# Patient Record
Sex: Female | Born: 1955 | Race: White | Hispanic: No | Marital: Single | Smoking: Former smoker
Health system: Southern US, Community
[De-identification: ages and names within clinical notes are randomized; demographics above are authoritative.]

## PROBLEM LIST (undated history)

## (undated) DIAGNOSIS — G35 Multiple sclerosis: Secondary | ICD-10-CM

## (undated) DIAGNOSIS — N63 Unspecified lump in unspecified breast: Secondary | ICD-10-CM

## (undated) DIAGNOSIS — M1990 Unspecified osteoarthritis, unspecified site: Secondary | ICD-10-CM

## (undated) DIAGNOSIS — G35D Multiple sclerosis, unspecified: Secondary | ICD-10-CM

## (undated) DIAGNOSIS — K219 Gastro-esophageal reflux disease without esophagitis: Secondary | ICD-10-CM

## (undated) HISTORY — PX: NOSE SURGERY: SHX723

## (undated) HISTORY — PX: ESOPHAGOGASTRODUODENOSCOPY: SHX1529

---

## 1992-06-23 HISTORY — PX: BREAST CYST EXCISION: SHX579

## 1992-06-23 HISTORY — PX: BREAST EXCISIONAL BIOPSY: SUR124

## 1998-03-08 ENCOUNTER — Other Ambulatory Visit: Admission: RE | Admit: 1998-03-08 | Discharge: 1998-03-08 | Payer: Self-pay | Admitting: Obstetrics & Gynecology

## 1999-04-04 ENCOUNTER — Other Ambulatory Visit: Admission: RE | Admit: 1999-04-04 | Discharge: 1999-04-04 | Payer: Self-pay | Admitting: Obstetrics & Gynecology

## 2000-06-03 ENCOUNTER — Other Ambulatory Visit: Admission: RE | Admit: 2000-06-03 | Discharge: 2000-06-03 | Payer: Self-pay | Admitting: Obstetrics & Gynecology

## 2001-06-03 ENCOUNTER — Other Ambulatory Visit: Admission: RE | Admit: 2001-06-03 | Discharge: 2001-06-03 | Payer: Self-pay | Admitting: Obstetrics & Gynecology

## 2002-06-22 ENCOUNTER — Other Ambulatory Visit: Admission: RE | Admit: 2002-06-22 | Discharge: 2002-06-22 | Payer: Self-pay | Admitting: Obstetrics & Gynecology

## 2003-08-03 ENCOUNTER — Other Ambulatory Visit: Admission: RE | Admit: 2003-08-03 | Discharge: 2003-08-03 | Payer: Self-pay | Admitting: Obstetrics & Gynecology

## 2004-10-02 ENCOUNTER — Other Ambulatory Visit
Admission: RE | Admit: 2004-10-02 | Discharge: 2004-10-02 | Payer: BLUE CROSS/BLUE SHIELD | Admitting: Obstetrics & Gynecology

## 2012-01-13 DIAGNOSIS — G35 Multiple sclerosis: Secondary | ICD-10-CM | POA: Insufficient documentation

## 2015-05-24 ENCOUNTER — Other Ambulatory Visit: Payer: Self-pay | Admitting: Neurosurgery

## 2015-05-29 ENCOUNTER — Encounter (HOSPITAL_COMMUNITY): Payer: Self-pay

## 2015-05-29 ENCOUNTER — Encounter (HOSPITAL_COMMUNITY)
Admission: RE | Admit: 2015-05-29 | Discharge: 2015-05-29 | Disposition: A | Discharge status: Home / Self Care | Payer: BLUE CROSS/BLUE SHIELD | Source: Ambulatory Visit | Attending: Neurosurgery | Admitting: Neurosurgery

## 2015-05-29 DIAGNOSIS — Z01812 Encounter for preprocedural laboratory examination: Secondary | ICD-10-CM | POA: Insufficient documentation

## 2015-05-29 DIAGNOSIS — M4310 Spondylolisthesis, site unspecified: Secondary | ICD-10-CM | POA: Diagnosis not present

## 2015-05-29 DIAGNOSIS — Z0183 Encounter for blood typing: Secondary | ICD-10-CM | POA: Insufficient documentation

## 2015-05-29 HISTORY — DX: Unspecified osteoarthritis, unspecified site: M19.90

## 2015-05-29 HISTORY — DX: Gastro-esophageal reflux disease without esophagitis: K21.9

## 2015-05-29 HISTORY — DX: Multiple sclerosis, unspecified: G35.D

## 2015-05-29 HISTORY — DX: Multiple sclerosis: G35

## 2015-05-29 LAB — SURGICAL PCR SCREEN
MRSA, PCR: NEGATIVE
Staphylococcus aureus: NEGATIVE

## 2015-05-29 LAB — BASIC METABOLIC PANEL
Anion gap: 10 (ref 5–15)
BUN: 8 mg/dL (ref 6–20)
CHLORIDE: 107 mmol/L (ref 101–111)
CO2: 23 mmol/L (ref 22–32)
CREATININE: 0.58 mg/dL (ref 0.44–1.00)
Calcium: 8.9 mg/dL (ref 8.9–10.3)
Glucose, Bld: 99 mg/dL (ref 65–99)
POTASSIUM: 4 mmol/L (ref 3.5–5.1)
Sodium: 140 mmol/L (ref 135–145)

## 2015-05-29 LAB — CBC
HEMATOCRIT: 38.6 % (ref 36.0–46.0)
Hemoglobin: 13.1 g/dL (ref 12.0–15.0)
MCH: 33 pg (ref 26.0–34.0)
MCHC: 33.9 g/dL (ref 30.0–36.0)
MCV: 97.2 fL (ref 78.0–100.0)
Platelets: 231 10*3/uL (ref 150–400)
RBC: 3.97 MIL/uL (ref 3.87–5.11)
RDW: 11.7 % (ref 11.5–15.5)
WBC: 4.7 10*3/uL (ref 4.0–10.5)

## 2015-05-29 LAB — TYPE AND SCREEN
ABO/RH(D): AB POS
Antibody Screen: NEGATIVE

## 2015-05-29 LAB — ABO/RH: ABO/RH(D): AB POS

## 2015-05-29 NOTE — Pre-Procedure Instructions (Signed)
Christina Richard  05/29/2015     Your procedure is scheduled on : Tuesday June 05, 2015 at 11:00 AM.  Report to [facility] Admitting at 8:00 AM.  Call this number if you have problems the morning of surgery: [phone]    Remember:  Do not eat food or drink liquids after midnight.   Take these medicines the morning of surgery with A SIP OF WATER : Nexium, Estradiol (Estrace), and Tecfidera    Stop taking any aspirin, herbal medications, vitamins, Ibuprofen, Advil, Motrin, Aleve, etc    Do not wear jewelry, make-up or nail polish.  Do not wear lotions, powders, or perfumes.    Do not shave 48 hours prior to surgery.    Do not bring valuables to the hospital.  [facility] is not responsible for any belongings or valuables.  Contacts, dentures or bridgework may not be worn into surgery.  Leave your suitcase in the car.  After surgery it may be brought to your room.  For patients admitted to the hospital, discharge time will be determined by your treatment team.  Patients discharged the day of surgery will not be allowed to drive home.   Name and phone number of your driver:    Special instructions:  Shower using CHG soap the night before and the morning of your surgery  Please read over the following fact sheets that you were given. Pain Booklet, Coughing and Deep Breathing, Blood Transfusion Information, MRSA Information and Surgical Site Infection Prevention

## 2015-06-05 ENCOUNTER — Inpatient Hospital Stay (HOSPITAL_COMMUNITY): Admission: RE | Admit: 2015-06-05 | Payer: BLUE CROSS/BLUE SHIELD | Source: Ambulatory Visit | Admitting: Neurosurgery

## 2015-06-05 ENCOUNTER — Encounter (HOSPITAL_COMMUNITY): Admission: RE | Payer: BLUE CROSS/BLUE SHIELD | Source: Ambulatory Visit

## 2015-06-05 SURGERY — ANTERIOR LATERAL LUMBAR FUSION 1 LEVEL
Anesthesia: General

## 2015-06-06 ENCOUNTER — Other Ambulatory Visit: Payer: Self-pay | Admitting: Neurosurgery

## 2015-06-13 ENCOUNTER — Encounter (HOSPITAL_COMMUNITY): Payer: Self-pay | Admitting: *Deleted

## 2015-06-13 DIAGNOSIS — G35 Multiple sclerosis: Secondary | ICD-10-CM | POA: Insufficient documentation

## 2015-06-13 DIAGNOSIS — M1990 Unspecified osteoarthritis, unspecified site: Secondary | ICD-10-CM | POA: Insufficient documentation

## 2015-06-13 DIAGNOSIS — M4310 Spondylolisthesis, site unspecified: Secondary | ICD-10-CM

## 2015-06-13 DIAGNOSIS — G2581 Restless legs syndrome: Secondary | ICD-10-CM | POA: Insufficient documentation

## 2015-06-13 MED ORDER — CEFAZOLIN SODIUM-DEXTROSE 2-3 GM-% IV SOLR
2.0000 g | INTRAVENOUS | Status: AC
  Administered 2015-06-14: 2 g via INTRAVENOUS
  Filled 2015-06-13: qty 50

## 2015-06-13 NOTE — Anesthesia Preprocedure Evaluation (Addendum)
Anesthesia Evaluation  Patient identified by MRN, date of birth, ID bandGeneral Assessment Comment: Christina Richard is a 59 y/o female with past history significant for relapsing-remitting multiple sclerosis on Tecfidera - doing well on the medication, GERD, and arthritis here today for a ANTERIOR LATERAL LUMBAR FUSION 1 LEVEL LUMBAR PERCUTANEOUS PEDICLE SCREW 1 LEVEL by Dr. Conchita Paris d/t spondylolisthesis.       Reviewed: Allergy & Precautions, NPO status , Patient's Chart, lab work & pertinent test results  History of Anesthesia Complications Negative for: history of anesthetic complications  Airway Mallampati: II  TM Distance: >3 FB Neck ROM: Full    Dental  (+) Teeth Intact   Pulmonary former smoker,    breath sounds clear to auscultation       Cardiovascular Exercise Tolerance: Poor negative cardio ROS   Rhythm:Regular     Neuro/Psych Restless leg syndrome MS     Neuromuscular disease negative psych ROS   GI/Hepatic Neg liver ROS, GERD  Medicated and Controlled,  Endo/Other  negative endocrine ROS  Renal/GU negative Renal ROS     Musculoskeletal  (+) Arthritis , Osteoarthritis,    Abdominal   Peds  Hematology   Anesthesia Other Findings   Reproductive/Obstetrics                            BP Readings from Last 3 Encounters:  05/29/15 121/63   Lab Results  Component Value Date   WBC 4.7 05/29/2015   HGB 13.1 05/29/2015   HCT 38.6 05/29/2015   MCV 97.2 05/29/2015   PLT 231 05/29/2015     Chemistry      Component Value Date/Time   NA 140 05/29/2015 1224   K 4.0 05/29/2015 1224   CL 107 05/29/2015 1224   CO2 23 05/29/2015 1224   BUN 8 05/29/2015 1224   CREATININE 0.58 05/29/2015 1224      Component Value Date/Time   CALCIUM 8.9 05/29/2015 1224      Anesthesia Physical Anesthesia Plan  ASA: II  Anesthesia Plan: General   Post-op Pain Management:     Induction: Intravenous  Airway Management Planned: Oral ETT  Additional Equipment: None  Intra-op Plan:   Post-operative Plan: Extubation in OR  Informed Consent: I have reviewed the patients History and Physical, chart, labs and discussed the procedure including the risks, benefits and alternatives for the proposed anesthesia with the patient or authorized representative who has indicated his/her understanding and acceptance.   Dental advisory given  Plan Discussed with: CRNA and Surgeon  Anesthesia Plan Comments:         Anesthesia Quick Evaluation

## 2015-06-13 NOTE — Progress Notes (Signed)
Pt denies SOB, chest pain, and being under the care of a cardiologist. Pt denies having any cardiac studies. Pt made aware to stop Stop taking Aspirin, otc vitamins, fish oil, and herbal medications. Do not take any NSAIDs ie: Ibuprofen, Advil, Naproxen or any medication containing Aspirin. Pt verbalized understanding of all pre-op instructions.

## 2015-06-14 ENCOUNTER — Encounter (HOSPITAL_COMMUNITY): Payer: Self-pay | Admitting: *Deleted

## 2015-06-14 ENCOUNTER — Inpatient Hospital Stay (HOSPITAL_COMMUNITY): Payer: BLUE CROSS/BLUE SHIELD | Admitting: Certified Registered Nurse Anesthetist

## 2015-06-14 ENCOUNTER — Inpatient Hospital Stay (HOSPITAL_COMMUNITY): Payer: BLUE CROSS/BLUE SHIELD

## 2015-06-14 ENCOUNTER — Encounter (HOSPITAL_COMMUNITY)
Admission: RE | Disposition: A | Discharge status: Home / Self Care | Payer: Self-pay | Source: Ambulatory Visit | Attending: Neurosurgery

## 2015-06-14 ENCOUNTER — Inpatient Hospital Stay (HOSPITAL_COMMUNITY)
Admission: RE | Admit: 2015-06-14 | Discharge: 2015-06-15 | DRG: 460 | Disposition: A | Discharge status: Home / Self Care | Payer: BLUE CROSS/BLUE SHIELD | Source: Ambulatory Visit | Attending: Neurosurgery | Admitting: Neurosurgery

## 2015-06-14 DIAGNOSIS — M4310 Spondylolisthesis, site unspecified: Secondary | ICD-10-CM

## 2015-06-14 DIAGNOSIS — Z87891 Personal history of nicotine dependence: Secondary | ICD-10-CM | POA: Diagnosis not present

## 2015-06-14 DIAGNOSIS — M4316 Spondylolisthesis, lumbar region: Principal | ICD-10-CM | POA: Diagnosis present

## 2015-06-14 DIAGNOSIS — K219 Gastro-esophageal reflux disease without esophagitis: Secondary | ICD-10-CM | POA: Diagnosis present

## 2015-06-14 DIAGNOSIS — G2581 Restless legs syndrome: Secondary | ICD-10-CM | POA: Diagnosis present

## 2015-06-14 DIAGNOSIS — M47816 Spondylosis without myelopathy or radiculopathy, lumbar region: Secondary | ICD-10-CM | POA: Diagnosis present

## 2015-06-14 DIAGNOSIS — Z79899 Other long term (current) drug therapy: Secondary | ICD-10-CM | POA: Diagnosis not present

## 2015-06-14 DIAGNOSIS — Z419 Encounter for procedure for purposes other than remedying health state, unspecified: Secondary | ICD-10-CM

## 2015-06-14 DIAGNOSIS — G35 Multiple sclerosis: Secondary | ICD-10-CM | POA: Diagnosis present

## 2015-06-14 DIAGNOSIS — M79606 Pain in leg, unspecified: Secondary | ICD-10-CM | POA: Diagnosis present

## 2015-06-14 HISTORY — PX: LUMBAR PERCUTANEOUS PEDICLE SCREW 1 LEVEL: SHX5560

## 2015-06-14 HISTORY — PX: ANTERIOR LAT LUMBAR FUSION: SHX1168

## 2015-06-14 LAB — CBC
HCT: 40.3 % (ref 36.0–46.0)
HEMOGLOBIN: 13.8 g/dL (ref 12.0–15.0)
MCH: 33.7 pg (ref 26.0–34.0)
MCHC: 34.2 g/dL (ref 30.0–36.0)
MCV: 98.5 fL (ref 78.0–100.0)
PLATELETS: 212 10*3/uL (ref 150–400)
RBC: 4.09 MIL/uL (ref 3.87–5.11)
RDW: 11.9 % (ref 11.5–15.5)
WBC: 5.1 10*3/uL (ref 4.0–10.5)

## 2015-06-14 LAB — BASIC METABOLIC PANEL
ANION GAP: 8 (ref 5–15)
BUN: 11 mg/dL (ref 6–20)
CALCIUM: 9.2 mg/dL (ref 8.9–10.3)
CO2: 25 mmol/L (ref 22–32)
Chloride: 107 mmol/L (ref 101–111)
Creatinine, Ser: 0.46 mg/dL (ref 0.44–1.00)
Glucose, Bld: 87 mg/dL (ref 65–99)
Potassium: 4.3 mmol/L (ref 3.5–5.1)
SODIUM: 140 mmol/L (ref 135–145)

## 2015-06-14 LAB — TYPE AND SCREEN
ABO/RH(D): AB POS
ANTIBODY SCREEN: NEGATIVE

## 2015-06-14 SURGERY — ANTERIOR LATERAL LUMBAR FUSION 1 LEVEL
Anesthesia: General | Site: Spine Lumbar

## 2015-06-14 MED ORDER — FENTANYL CITRATE (PF) 250 MCG/5ML IJ SOLN
INTRAMUSCULAR | Status: AC
  Filled 2015-06-14: qty 5

## 2015-06-14 MED ORDER — MULTI-VITAMIN/MINERALS PO TABS: 1.0000 | ORAL_TABLET | Freq: Every day | ORAL | Status: DC

## 2015-06-14 MED ORDER — LACTATED RINGERS IV SOLN
INTRAVENOUS | Status: DC | PRN
  Administered 2015-06-14 (×3): via INTRAVENOUS

## 2015-06-14 MED ORDER — MIDAZOLAM HCL 2 MG/2ML IJ SOLN
INTRAMUSCULAR | Status: AC
  Filled 2015-06-14: qty 2

## 2015-06-14 MED ORDER — GLYCOPYRROLATE 0.2 MG/ML IJ SOLN
INTRAMUSCULAR | Status: AC
  Filled 2015-06-14: qty 1

## 2015-06-14 MED ORDER — HYDROMORPHONE HCL 1 MG/ML IJ SOLN
INTRAMUSCULAR | Status: AC
  Administered 2015-06-14: 0.5 mg via INTRAVENOUS
  Filled 2015-06-14: qty 1

## 2015-06-14 MED ORDER — ACETAMINOPHEN 160 MG/5ML PO SOLN: 325.0000 mg | ORAL | Status: DC | PRN

## 2015-06-14 MED ORDER — METHOCARBAMOL 500 MG PO TABS
ORAL_TABLET | ORAL | Status: AC
Start: 2015-06-14 — End: 2015-06-15
  Filled 2015-06-14: qty 1

## 2015-06-14 MED ORDER — PROPOFOL 10 MG/ML IV BOLUS
INTRAVENOUS | Status: AC
  Filled 2015-06-14: qty 40

## 2015-06-14 MED ORDER — ARTIFICIAL TEARS OP OINT
TOPICAL_OINTMENT | OPHTHALMIC | Status: AC
  Filled 2015-06-14: qty 3.5

## 2015-06-14 MED ORDER — MENTHOL 3 MG MT LOZG: 1.0000 | LOZENGE | OROMUCOSAL | Status: DC | PRN

## 2015-06-14 MED ORDER — ADULT MULTIVITAMIN W/MINERALS CH: 1.0000 | ORAL_TABLET | Freq: Every day | ORAL | Status: DC

## 2015-06-14 MED ORDER — ALBUMIN HUMAN 5 % IV SOLN
INTRAVENOUS | Status: DC | PRN
  Administered 2015-06-14: 11:00:00 via INTRAVENOUS

## 2015-06-14 MED ORDER — 0.9 % SODIUM CHLORIDE (POUR BTL) OPTIME
TOPICAL | Status: DC | PRN
  Administered 2015-06-14: 1000 mL

## 2015-06-14 MED ORDER — ARTIFICIAL TEARS OP OINT
TOPICAL_OINTMENT | OPHTHALMIC | Status: DC | PRN
  Administered 2015-06-14: 1 via OPHTHALMIC

## 2015-06-14 MED ORDER — DIMETHYL FUMARATE 240 MG PO CPDR: 1.0000 | DELAYED_RELEASE_CAPSULE | Freq: Two times a day (BID) | ORAL | Status: DC

## 2015-06-14 MED ORDER — DOCUSATE SODIUM 100 MG PO CAPS
100.0000 mg | ORAL_CAPSULE | Freq: Two times a day (BID) | ORAL | Status: DC
  Administered 2015-06-14: 100 mg via ORAL
  Filled 2015-06-14: qty 1

## 2015-06-14 MED ORDER — VITAMIN D 1000 UNITS PO TABS
1000.0000 [IU] | ORAL_TABLET | Freq: Every day | ORAL | Status: DC
  Filled 2015-06-14: qty 1

## 2015-06-14 MED ORDER — MODAFINIL 100 MG PO TABS: 200.0000 mg | ORAL_TABLET | Freq: Every day | ORAL | Status: DC

## 2015-06-14 MED ORDER — SODIUM CHLORIDE 0.9 % IJ SOLN
3.0000 mL | Freq: Two times a day (BID) | INTRAMUSCULAR | Status: DC
  Administered 2015-06-14: 3 mL via INTRAVENOUS

## 2015-06-14 MED ORDER — LIDOCAINE HCL (CARDIAC) 20 MG/ML IV SOLN
INTRAVENOUS | Status: DC | PRN
  Administered 2015-06-14: 50 mg via INTRAVENOUS

## 2015-06-14 MED ORDER — SODIUM CHLORIDE 0.9 % IJ SOLN
INTRAMUSCULAR | Status: AC
  Filled 2015-06-14: qty 10

## 2015-06-14 MED ORDER — SODIUM CHLORIDE 0.9 % IV SOLN: 250.0000 mL | INTRAVENOUS | Status: DC

## 2015-06-14 MED ORDER — MEDROXYPROGESTERONE ACETATE 2.5 MG PO TABS
2.5000 mg | ORAL_TABLET | Freq: Every day | ORAL | Status: DC
  Filled 2015-06-14: qty 1

## 2015-06-14 MED ORDER — HYDROMORPHONE HCL 1 MG/ML IJ SOLN
0.2500 mg | INTRAMUSCULAR | Status: DC | PRN
  Administered 2015-06-14 (×4): 0.5 mg via INTRAVENOUS

## 2015-06-14 MED ORDER — HEPARIN SODIUM (PORCINE) 5000 UNIT/ML IJ SOLN
5000.0000 [IU] | Freq: Three times a day (TID) | INTRAMUSCULAR | Status: DC
  Filled 2015-06-14: qty 1

## 2015-06-14 MED ORDER — LIDOCAINE HCL (CARDIAC) 20 MG/ML IV SOLN
INTRAVENOUS | Status: AC
  Filled 2015-06-14: qty 5

## 2015-06-14 MED ORDER — PANTOPRAZOLE SODIUM 40 MG PO TBEC: 40.0000 mg | DELAYED_RELEASE_TABLET | Freq: Every day | ORAL | Status: DC

## 2015-06-14 MED ORDER — OXYCODONE-ACETAMINOPHEN 5-325 MG PO TABS
1.0000 | ORAL_TABLET | ORAL | Status: DC | PRN
  Administered 2015-06-14 – 2015-06-15 (×5): 2 via ORAL
  Filled 2015-06-14 (×4): qty 2

## 2015-06-14 MED ORDER — SUCCINYLCHOLINE CHLORIDE 20 MG/ML IJ SOLN
INTRAMUSCULAR | Status: DC | PRN
  Administered 2015-06-14: 40 mg via INTRAVENOUS

## 2015-06-14 MED ORDER — PROPOFOL 500 MG/50ML IV EMUL
INTRAVENOUS | Status: DC | PRN
  Administered 2015-06-14: 50 ug/kg/min via INTRAVENOUS

## 2015-06-14 MED ORDER — ESTRADIOL 1 MG PO TABS
0.5000 mg | ORAL_TABLET | Freq: Every day | ORAL | Status: DC
  Filled 2015-06-14: qty 0.5

## 2015-06-14 MED ORDER — MORPHINE SULFATE (PF) 2 MG/ML IV SOLN: 1.0000 mg | INTRAVENOUS | Status: DC | PRN

## 2015-06-14 MED ORDER — DEXAMETHASONE SODIUM PHOSPHATE 4 MG/ML IJ SOLN
INTRAMUSCULAR | Status: DC | PRN
  Administered 2015-06-14: 4 mg via INTRAVENOUS

## 2015-06-14 MED ORDER — HEMOSTATIC AGENTS (NO CHARGE) OPTIME
TOPICAL | Status: DC | PRN
  Administered 2015-06-14: 1 via TOPICAL

## 2015-06-14 MED ORDER — THROMBIN 5000 UNITS EX SOLR
CUTANEOUS | Status: DC | PRN
  Administered 2015-06-14 (×2): 5000 [IU] via TOPICAL

## 2015-06-14 MED ORDER — PHENYLEPHRINE HCL 10 MG/ML IJ SOLN
10.0000 mg | INTRAMUSCULAR | Status: DC | PRN
  Administered 2015-06-14: 30 ug/min via INTRAVENOUS

## 2015-06-14 MED ORDER — METHOCARBAMOL 500 MG PO TABS
500.0000 mg | ORAL_TABLET | Freq: Four times a day (QID) | ORAL | Status: DC | PRN
  Administered 2015-06-14: 500 mg via ORAL

## 2015-06-14 MED ORDER — BUPIVACAINE HCL (PF) 0.5 % IJ SOLN
INTRAMUSCULAR | Status: DC | PRN
  Administered 2015-06-14: 3.5 mL
  Administered 2015-06-14: 7 mL

## 2015-06-14 MED ORDER — ROCURONIUM BROMIDE 50 MG/5ML IV SOLN
INTRAVENOUS | Status: AC
  Filled 2015-06-14: qty 1

## 2015-06-14 MED ORDER — GLYCOPYRROLATE 0.2 MG/ML IJ SOLN
INTRAMUSCULAR | Status: DC | PRN
  Administered 2015-06-14 (×2): 0.1 mg via INTRAVENOUS

## 2015-06-14 MED ORDER — PHENOL 1.4 % MT LIQD: 1.0000 | OROMUCOSAL | Status: DC | PRN

## 2015-06-14 MED ORDER — SODIUM CHLORIDE 0.9 % IR SOLN
Status: DC | PRN
  Administered 2015-06-14: 09:00:00

## 2015-06-14 MED ORDER — OXYCODONE HCL 5 MG PO TABS: 5.0000 mg | ORAL_TABLET | Freq: Once | ORAL | Status: DC | PRN

## 2015-06-14 MED ORDER — ONDANSETRON HCL 4 MG/2ML IJ SOLN
INTRAMUSCULAR | Status: DC | PRN
  Administered 2015-06-14: 4 mg via INTRAVENOUS

## 2015-06-14 MED ORDER — ACETAMINOPHEN 650 MG RE SUPP: 650.0000 mg | RECTAL | Status: DC | PRN

## 2015-06-14 MED ORDER — ONDANSETRON HCL 4 MG/2ML IJ SOLN: 4.0000 mg | INTRAMUSCULAR | Status: DC | PRN

## 2015-06-14 MED ORDER — METHOCARBAMOL 1000 MG/10ML IJ SOLN
500.0000 mg | Freq: Four times a day (QID) | INTRAVENOUS | Status: DC | PRN
  Filled 2015-06-14: qty 5

## 2015-06-14 MED ORDER — LORATADINE 10 MG PO TABS: 10.0000 mg | ORAL_TABLET | Freq: Every day | ORAL | Status: DC

## 2015-06-14 MED ORDER — SODIUM CHLORIDE 0.9 % IJ SOLN: 3.0000 mL | INTRAMUSCULAR | Status: DC | PRN

## 2015-06-14 MED ORDER — OXYCODONE HCL 5 MG/5ML PO SOLN: 5.0000 mg | Freq: Once | ORAL | Status: DC | PRN

## 2015-06-14 MED ORDER — EPHEDRINE SULFATE 50 MG/ML IJ SOLN
INTRAMUSCULAR | Status: AC
  Filled 2015-06-14: qty 1

## 2015-06-14 MED ORDER — SENNA 8.6 MG PO TABS
1.0000 | ORAL_TABLET | Freq: Two times a day (BID) | ORAL | Status: DC
  Administered 2015-06-14: 8.6 mg via ORAL
  Filled 2015-06-14: qty 1

## 2015-06-14 MED ORDER — LIDOCAINE-EPINEPHRINE 1 %-1:100000 IJ SOLN
INTRAMUSCULAR | Status: DC | PRN
  Administered 2015-06-14: 3.5 mL
  Administered 2015-06-14: 7 mL

## 2015-06-14 MED ORDER — SUCCINYLCHOLINE CHLORIDE 20 MG/ML IJ SOLN
INTRAMUSCULAR | Status: AC
  Filled 2015-06-14: qty 1

## 2015-06-14 MED ORDER — GABAPENTIN 300 MG PO CAPS
300.0000 mg | ORAL_CAPSULE | Freq: Every day | ORAL | Status: DC
  Administered 2015-06-14: 300 mg via ORAL
  Filled 2015-06-14: qty 1

## 2015-06-14 MED ORDER — ACETAMINOPHEN 325 MG PO TABS
650.0000 mg | ORAL_TABLET | ORAL | Status: DC | PRN
Start: 2015-06-14 — End: 2015-06-15

## 2015-06-14 MED ORDER — MIDAZOLAM HCL 5 MG/5ML IJ SOLN
INTRAMUSCULAR | Status: DC | PRN
  Administered 2015-06-14: 2 mg via INTRAVENOUS

## 2015-06-14 MED ORDER — ZOLPIDEM TARTRATE 5 MG PO TABS: 5.0000 mg | ORAL_TABLET | Freq: Every evening | ORAL | Status: DC | PRN

## 2015-06-14 MED ORDER — PROPOFOL 10 MG/ML IV BOLUS
INTRAVENOUS | Status: DC | PRN
Start: 2015-06-14 — End: 2015-06-14
  Administered 2015-06-14: 110 mg via INTRAVENOUS

## 2015-06-14 MED ORDER — ACETAMINOPHEN 325 MG PO TABS: 325.0000 mg | ORAL_TABLET | ORAL | Status: DC | PRN

## 2015-06-14 MED ORDER — OXYCODONE-ACETAMINOPHEN 5-325 MG PO TABS
ORAL_TABLET | ORAL | Status: AC
  Filled 2015-06-14: qty 2

## 2015-06-14 MED ORDER — FENTANYL CITRATE (PF) 100 MCG/2ML IJ SOLN
INTRAMUSCULAR | Status: DC | PRN
  Administered 2015-06-14: 50 ug via INTRAVENOUS
  Administered 2015-06-14: 150 ug via INTRAVENOUS
  Administered 2015-06-14 (×2): 50 ug via INTRAVENOUS

## 2015-06-14 MED ORDER — CHOLECALCIFEROL 25 MCG (1000 UT) PO CAPS
1000.0000 [IU] | ORAL_CAPSULE | Freq: Every day | ORAL | Status: DC
  Filled 2015-06-14: qty 1

## 2015-06-14 MED ORDER — SODIUM CHLORIDE 0.9 % IV SOLN: INTRAVENOUS | Status: DC

## 2015-06-14 MED ORDER — PHENYLEPHRINE 40 MCG/ML (10ML) SYRINGE FOR IV PUSH (FOR BLOOD PRESSURE SUPPORT)
PREFILLED_SYRINGE | INTRAVENOUS | Status: AC
  Filled 2015-06-14: qty 10

## 2015-06-14 MED ORDER — BISACODYL 10 MG RE SUPP: 10.0000 mg | Freq: Every day | RECTAL | Status: DC | PRN

## 2015-06-14 MED ORDER — CEFAZOLIN SODIUM-DEXTROSE 2-3 GM-% IV SOLR
2.0000 g | Freq: Three times a day (TID) | INTRAVENOUS | Status: AC
  Administered 2015-06-14 – 2015-06-15 (×2): 2 g via INTRAVENOUS
  Filled 2015-06-14 (×2): qty 50

## 2015-06-14 SURGICAL SUPPLY — 78 items
BAG DECANTER FOR FLEXI CONT (MISCELLANEOUS) ×2 IMPLANT
BENZOIN TINCTURE PRP APPL 2/3 (GAUZE/BANDAGES/DRESSINGS) IMPLANT
BLADE CLIPPER SURG (BLADE) IMPLANT
BLADE SURG 11 STRL SS (BLADE) ×2 IMPLANT
BUR MATCHSTICK NEURO 3.0 LAGG (BURR) ×2 IMPLANT
BUR PRECISION FLUTE 5.0 (BURR) ×2 IMPLANT
CANISTER SUCT 3000ML PPV (MISCELLANEOUS) ×2 IMPLANT
CONT SPEC 4OZ CLIKSEAL STRL BL (MISCELLANEOUS) ×2 IMPLANT
COVER BACK TABLE 24X17X13 BIG (DRAPES) IMPLANT
COVER BACK TABLE 60X90IN (DRAPES) ×2 IMPLANT
DECANTER SPIKE VIAL GLASS SM (MISCELLANEOUS) ×4 IMPLANT
DERMABOND ADVANCED (GAUZE/BANDAGES/DRESSINGS) ×3
DERMABOND ADVANCED .7 DNX12 (GAUZE/BANDAGES/DRESSINGS) ×3 IMPLANT
DRAPE C-ARM 42X72 X-RAY (DRAPES) ×4 IMPLANT
DRAPE C-ARMOR (DRAPES) ×4 IMPLANT
DRAPE LAPAROTOMY 100X72X124 (DRAPES) ×4 IMPLANT
DRAPE POUCH INSTRU U-SHP 10X18 (DRAPES) ×4 IMPLANT
DRAPE SURG 17X23 STRL (DRAPES) ×2 IMPLANT
DRSG OPSITE 4X5.5 SM (GAUZE/BANDAGES/DRESSINGS) ×8 IMPLANT
DURAPREP 26ML APPLICATOR (WOUND CARE) ×4 IMPLANT
ELECT REM PT RETURN 9FT ADLT (ELECTROSURGICAL) ×4
ELECTRODE REM PT RTRN 9FT ADLT (ELECTROSURGICAL) ×2 IMPLANT
GAUZE SPONGE 4X4 12PLY STRL (GAUZE/BANDAGES/DRESSINGS) IMPLANT
GAUZE SPONGE 4X4 16PLY XRAY LF (GAUZE/BANDAGES/DRESSINGS) IMPLANT
GLOVE BIOGEL PI IND STRL 7.5 (GLOVE) ×2 IMPLANT
GLOVE BIOGEL PI INDICATOR 7.5 (GLOVE) ×2
GLOVE ECLIPSE 7.0 STRL STRAW (GLOVE) ×4 IMPLANT
GLOVE EXAM NITRILE LRG STRL (GLOVE) IMPLANT
GLOVE EXAM NITRILE MD LF STRL (GLOVE) IMPLANT
GLOVE EXAM NITRILE XL STR (GLOVE) IMPLANT
GLOVE EXAM NITRILE XS STR PU (GLOVE) IMPLANT
GOWN STRL REUS W/ TWL LRG LVL3 (GOWN DISPOSABLE) ×2 IMPLANT
GOWN STRL REUS W/ TWL XL LVL3 (GOWN DISPOSABLE) ×2 IMPLANT
GOWN STRL REUS W/TWL 2XL LVL3 (GOWN DISPOSABLE) IMPLANT
GOWN STRL REUS W/TWL LRG LVL3 (GOWN DISPOSABLE) ×2
GOWN STRL REUS W/TWL XL LVL3 (GOWN DISPOSABLE) ×2
HEMOSTAT POWDER KIT SURGIFOAM (HEMOSTASIS) ×2 IMPLANT
IMPLANT COROENT XL 10X18X45 (Intraocular Lens) ×2 IMPLANT
KIT BASIN OR (CUSTOM PROCEDURE TRAY) ×4 IMPLANT
KIT DILATOR XLIF 5 (KITS) ×1 IMPLANT
KIT INFUSE XX SMALL 0.7CC (Orthopedic Implant) ×2 IMPLANT
KIT ROOM TURNOVER OR (KITS) ×4 IMPLANT
KIT SURGICAL ACCESS MAXCESS 4 (KITS) ×2 IMPLANT
KIT XLIF (KITS) ×1
MILL MEDIUM DISP (BLADE) ×2 IMPLANT
MODULE NVM5 NEXT GEN EMG (NEEDLE) ×2 IMPLANT
NEEDLE HYPO 18GX1.5 BLUNT FILL (NEEDLE) IMPLANT
NEEDLE HYPO 25X1 1.5 SAFETY (NEEDLE) ×4 IMPLANT
NEEDLE SPNL 18GX3.5 QUINCKE PK (NEEDLE) IMPLANT
NEEDLE TIGER EXPRESS TN200 (NEEDLE) ×8 IMPLANT
NS IRRIG 1000ML POUR BTL (IV SOLUTION) ×4 IMPLANT
PACK LAMINECTOMY NEURO (CUSTOM PROCEDURE TRAY) ×4 IMPLANT
PAD ARMBOARD 7.5X6 YLW CONV (MISCELLANEOUS) ×6 IMPLANT
PUTTY BONE ATTRAX 5CC STRIP (Putty) ×2 IMPLANT
ROD RELINE MAS LORD 5.5X45MM (Rod) ×2 IMPLANT
ROD RELINE MAS TI LORD 5.5X40 (Rod) ×2 IMPLANT
SCREW LOCK RELINE 5.5 TULIP (Screw) ×8 IMPLANT
SCREW MAS RELINE POLY 6.5X40 (Screw) ×8 IMPLANT
SPONGE LAP 4X18 X RAY DECT (DISPOSABLE) IMPLANT
SPONGE SURGIFOAM ABS GEL 100 (HEMOSTASIS) ×2 IMPLANT
SPONGE SURGIFOAM ABS GEL SZ50 (HEMOSTASIS) IMPLANT
STAPLER SKIN PROX WIDE 3.9 (STAPLE) ×2 IMPLANT
STRIP CLOSURE SKIN 1/2X4 (GAUZE/BANDAGES/DRESSINGS) IMPLANT
SUT VIC AB 0 CT1 18XCR BRD8 (SUTURE) ×1 IMPLANT
SUT VIC AB 0 CT1 8-18 (SUTURE) ×1
SUT VIC AB 1 CT1 18XBRD ANBCTR (SUTURE) ×1 IMPLANT
SUT VIC AB 1 CT1 8-18 (SUTURE) ×1
SUT VIC AB 2-0 CT1 18 (SUTURE) ×2 IMPLANT
SUT VIC AB 3-0 SH 8-18 (SUTURE) ×2 IMPLANT
SUT VICRYL 3-0 RB1 18 ABS (SUTURE) ×2 IMPLANT
SYR 3ML LL SCALE MARK (SYRINGE) IMPLANT
TAPE CLOTH 3X10 TAN LF (GAUZE/BANDAGES/DRESSINGS) ×6 IMPLANT
TOWEL OR 17X24 6PK STRL BLUE (TOWEL DISPOSABLE) ×4 IMPLANT
TOWEL OR 17X26 10 PK STRL BLUE (TOWEL DISPOSABLE) ×4 IMPLANT
TRAP SPECIMEN MUCOUS 40CC (MISCELLANEOUS) ×2 IMPLANT
TRAY FOLEY W/METER SILVER 14FR (SET/KITS/TRAYS/PACK) ×4 IMPLANT
WATER STERILE IRR 1000ML POUR (IV SOLUTION) ×4 IMPLANT
Y-WIRE SAFEWIRE 1.28X559 (WIRE) ×4 IMPLANT

## 2015-06-14 NOTE — Anesthesia Procedure Notes (Signed)
Procedure Name: Intubation Date/Time: 06/14/2015 7:41 AM Performed by: Fabian November Pre-anesthesia Checklist: Patient identified, Patient being monitored, Timeout performed, Emergency Drugs available and Suction available Patient Re-evaluated:Patient Re-evaluated prior to inductionOxygen Delivery Method: Circle System Utilized Preoxygenation: Pre-oxygenation with 100% oxygen Intubation Type: IV induction Ventilation: Mask ventilation without difficulty Laryngoscope Size: Miller and 3 Grade View: Grade I Tube type: Oral Tube size: 7.5 mm Number of attempts: 1 Airway Equipment and Method: Stylet Placement Confirmation: ETT inserted through vocal cords under direct vision,  positive ETCO2 and breath sounds checked- equal and bilateral Secured at: 21 cm Tube secured with: Tape Dental Injury: Teeth and Oropharynx as per pre-operative assessment

## 2015-06-14 NOTE — H&P (Signed)
CC:  Back pain  HPI: Miss Christina Richard is a 59 year old woman I'm seeing for back pain. She comes in with a primary complaint of several month history of left-sided back and leg pain. She says the pain started in the middle of July. She is describing pain which runs from the left side of her back, into the buttock, and down the back of her left leg. She also describes numbness of her left foot. She says this pain is getting worse, and is worse when she is walking, or with movement of any kind really. She picked up a back brace, which does seem to help somewhat with the pain. She does not really have any right-sided symptoms.   Since the pain started, she has undergone a course of physical therapy, and has been relatively compliant with the home exercise program they developed. Unfortunately, this has not really helped with her symptoms, may continue to be progressive. She has also visited a chiropractor, undergone multiple manipulations, traction, and application of a TENS unit which has not provided any relief. She has been taking Motrin multiple times a day on a daily basis which provides some modest relief. She has also been given a prescription for a muscle relaxer which does not really help much.   PMH: Past Medical History  Diagnosis Date  . MS (multiple sclerosis) (HCC)   . GERD (gastroesophageal reflux disease)   . Arthritis     PSH: Past Surgical History  Procedure Laterality Date  . Breast cyst excision Left 1994  . Nose surgery    . Esophagogastroduodenoscopy      SH: Social History  Substance Use Topics  . Smoking status: Former Games developer  . Smokeless tobacco: None  . Alcohol Use: No    MEDS: Prior to Admission medications   Medication Sig Start Date End Date Taking? Authorizing Provider  Cholecalciferol (CVS D3) 1000 UNITS capsule Take 1,000 Units by mouth daily.   Yes Historical Provider, MD  esomeprazole (NEXIUM) 20 MG capsule Take 20 mg by mouth daily at 12 noon.   Yes  Historical Provider, MD  estradiol (ESTRACE) 0.5 MG tablet Take 1 tablet by mouth daily. 04/17/15  Yes Historical Provider, MD  loratadine (CLARITIN) 10 MG tablet Take 10 mg by mouth daily.   Yes Historical Provider, MD  medroxyPROGESTERone (PROVERA) 2.5 MG tablet Take 1 tablet by mouth daily. 04/17/15  Yes Historical Provider, MD  Multiple Vitamins-Minerals (MULTIVITAMIN WITH MINERALS) tablet Take 1 tablet by mouth daily.   Yes Historical Provider, MD  NEURONTIN 300 MG capsule Take 1 capsule by mouth at bedtime. 04/10/15  Yes Historical Provider, MD  PROVIGIL 200 MG tablet Take 1 tablet by mouth daily. 04/10/15  Yes Historical Provider, MD  TECFIDERA 240 MG CPDR Take 1 capsule by mouth 2 (two) times daily. 04/24/15  Yes Historical Provider, MD    ALLERGY: No Known Allergies  ROS: ROS  NEUROLOGIC EXAM: Awake, alert, oriented Memory and concentration grossly intact Speech fluent, appropriate CN grossly intact Motor exam: Upper Extremities Deltoid Bicep Tricep Grip  Right 5/5 5/5 5/5 5/5  Left 5/5 5/5 5/5 5/5   Lower Extremity IP Quad PF DF EHL  Right 5/5 5/5 5/5 5/5 5/5  Left 5/5 5/5 5/5 5/5 5/5   Sensation grossly intact to LT  IMGAING: MRI of the lumbar spine was reviewed. This does demonstrate grade 1 anterolisthesis of L4 and L5. In the supine MRI, there is no significant neural compression.   Previous dynamic lumbar spine x-rays  were again reviewed. These demonstrate significant increase in spondylolisthesis in the upright posture, especially in flexion   IMPRESSION: 59 year old woman with mobile spondylolisthesis causing back and leg pain which has not been responsive to conservative treatments.  PLAN: We will plan on proceeding with stabilization of the mobile segment at L4 L5 with XLIF and percutaneous pedicle screw stabilization.   I did review the MRI findings with the patient. I told her that her pain is likely stemming from the mobility at L4 L5. Given her lack  of response to conservative treatments, I offered the above stabilization procedure. Risks of this procedure were discussed in detail including the possibility of psoas muscle weakness leading to hip flexion weakness, abdominal organ injury, vascular injury leading to catastrophic bleeding, CSF leak, and infection. The possible outcomes of surgery were also discussed including the possibility of continued pain. With an understanding of these risks, the patient agreed to proceed with surgery. All questions were answered.

## 2015-06-14 NOTE — Transfer of Care (Signed)
Immediate Anesthesia Transfer of Care Note  Patient: Christina Richard  Procedure(s) Performed: Procedure(s) with comments: LUMBAR FOUR-FIVE ANTERIOR LATERAL LUMBAR FUSION  (N/A) - L45 anterior lateral lumbar interbody fusion  LUMBAR FOUR-FIVE LUMBAR PERCUTANEOUS PEDICLE SCREW  (N/A) - L45 percutaneous pedicle screws  Patient Location: PACU  Anesthesia Type:General  Level of Consciousness: awake, alert , oriented and patient cooperative  Airway & Oxygen Therapy: Patient Spontanous Breathing and Patient connected to nasal cannula oxygen  Post-op Assessment: Report given to RN and Post -op Vital signs reviewed and stable  Post vital signs: Reviewed and stable  Last Vitals:  Filed Vitals:   06/14/15 0632  BP: 126/68  Pulse: 72  Temp: 36.6 C  Resp: 18    Complications: No apparent anesthesia complications

## 2015-06-14 NOTE — Op Note (Signed)
PREOP DIAGNOSIS:  1. Lumbago 2. Spondylolisthesis L4-5   POSTOP DIAGNOSIS: Same  PROCEDURE: 1. Lateral approach for L4-5 discectomy 2. Interbody arthrodesis, L4-5  3. Placement of anterior interbody device, NuVasive 10 degree lordotic, 68mm x 12mm graft 4. Non-segmental pedicle screw instrumentation, NuVasive Reline L4-L5 6.51mm x 25mm x4 5. Use of non-structural bone allograft 6. Use of BMP-2  SURGEON: Dr. Lisbeth Renshaw, MD  ASSISTANT: Dr. Hilda Lias  ANESTHESIA: General Endotracheal  EBL: Minimal  SPECIMENS: None  DRAINS: None  COMPLICATIONS: None immediate  CONDITION: Hemodynamically stable to PACU  HISTORY: Christina Richard is a 59 y.o. female initially seen in the outpatient clinic with back pain and leg pain. Workup included MRI and Xrays demonstrating mobile spondylolisthesis at L4-5. She attempted conservative treatments without improvement in her pain. She therefore elected to proceed with surgical stabilization and fusion. Risks and benefits were reviewed and consent was obtained.  PROCEDURE IN DETAIL: After informed consent was obtained and witnessed, the patient was brought to the operating room. After induction of general anesthesia, the patient was positioned on the operative table in the right lateral decubitus position. All pressure points were meticulously padded. AP and lateral fluoroscopic images were then used to identify the projection of the L4-5 interspace on the skin. We then confirmed good motor twitches.  After timeout was conducted, skin incisions were infiltrated with local anesthetic with epinephrine. A counter incision inferior to the 12th rib, and off the midline was made, dissected through subcutaneous tissue and the oblique musculature, the transversalis fascia was penetrated, and the retro-peroneal space was dissected bluntly. A curvilinear incision was then made just above the ilium. Subcutaneous tissue was then dissected, and the first  small dilator was passed through the transversalis fascia and oblique musculature into the previously dissected retroperitoneal space. This was then guided down onto the top of the psoas muscle. Using lateral fluoroscopy, the dilator was positioned on the L4-5 disc space, just anterior to the posterior margin of the L4 body. Sequential dilators were then placed, with continuous motor monitoring. We did identify the traversing nerves within the psoas muscle posterior to our dilators, maximally stimulated at approximately 9 mA with the largest dilator. K-wire was placed into the space. The self-retaining retractor was then placed, again under stimulation. This was then secured to the table. Position and trajectory of the retractor was confirmed using AP and lateral fluoroscopy. The posterior shim was then placed.  At this point, the retractor was opened, and under direct visualization the lateral annulus was identified. The annulus was then incised, and using a combination of curettes, rongeurs, and shavers, discectomy was completed. A trial 10 lordotic, 18 mm graft was placed, and position was confirmed with fluoroscopy. A similar sized graft was then selected, packed with the morcellized nonstructural bone allograft, and BMP, and tapped into place under fluoroscopy. Position was confirmed with AP and lateral images. The self-retaining retractor was then removed, but no bleeding was observed, and the wounds were closed in standard fashion.  At this point, the patient was then positioned on the operative table in the prone position with all pressure points meticulous he padded. After a second timeout was conducted, AP fluoroscopy was used to cannulate the L4 and L5 pedicles with Jamshidi needles. K wires were then placed, and screws were placed into the L4 and L5 pedicles. These were then connected with a rod passed subfascially, set screws were placed, final tightened, and position confirmed with AP and lateral  fluoroscopy.  Wounds  were then closed in standard fashion with 3-0 Vicryl and Dermabond. The patient was then transferred to the stretcher, extubated, and taken to the postanesthesia care unit in stable hemodynamic condition. At the end of the case all sponge, needle, and instrument counts were correct.

## 2015-06-15 ENCOUNTER — Encounter (HOSPITAL_COMMUNITY): Payer: Self-pay | Admitting: Neurosurgery

## 2015-06-15 LAB — CBC
HEMATOCRIT: 34.8 % — AB (ref 36.0–46.0)
HEMOGLOBIN: 11.9 g/dL — AB (ref 12.0–15.0)
MCH: 33.8 pg (ref 26.0–34.0)
MCHC: 34.2 g/dL (ref 30.0–36.0)
MCV: 98.9 fL (ref 78.0–100.0)
Platelets: 186 10*3/uL (ref 150–400)
RBC: 3.52 MIL/uL — AB (ref 3.87–5.11)
RDW: 11.9 % (ref 11.5–15.5)
WBC: 7.2 10*3/uL (ref 4.0–10.5)

## 2015-06-15 LAB — BASIC METABOLIC PANEL
ANION GAP: 6 (ref 5–15)
BUN: 7 mg/dL (ref 6–20)
CALCIUM: 8.9 mg/dL (ref 8.9–10.3)
CHLORIDE: 107 mmol/L (ref 101–111)
CO2: 29 mmol/L (ref 22–32)
Creatinine, Ser: 0.52 mg/dL (ref 0.44–1.00)
GFR calc non Af Amer: >60 mL/min (ref >60)
Glucose, Bld: 85 mg/dL (ref 65–99)
POTASSIUM: 4.2 mmol/L (ref 3.5–5.1)
Sodium: 142 mmol/L (ref 135–145)

## 2015-06-15 MED ORDER — OXYCODONE-ACETAMINOPHEN 5-325 MG PO TABS: 1.0000 | ORAL_TABLET | ORAL | Status: DC | PRN

## 2015-06-15 NOTE — Evaluation (Signed)
Physical Therapy Evaluation and Discharge Patient Details Name: Christina Richard MRN: [id number] DOB: May 19, 1956 Today's Date: 06/15/2015   History of Present Illness  Pt is a 59 y/o female who presents s/p L4-L5 Ant Lumbar fusion on 06/14/15. Pt has history of MS.   Clinical Impression  Patient evaluated by Physical Therapy with no further acute PT needs identified. All education has been completed and the patient has no further questions. At the time of PT eval pt was able to perform transfers and ambulation with modified independence. Pt demonstrated the ability to negotiate stairs with 1 UE support and supervision for safety. See below for any follow-up Physical Therapy or equipment needs. PT is signing off. Thank you for this referral.    Follow Up Recommendations Outpatient PT    Equipment Recommendations       Recommendations for Other Services       Precautions / Restrictions Precautions Precautions: Fall;Back Precaution Booklet Issued: Yes (comment) Precaution Comments: Reviewed handout and pt was educated on precautions throughout functional mobility.  Required Braces or Orthoses: Spinal Brace Spinal Brace: Lumbar corset;Applied in sitting position Restrictions Weight Bearing Restrictions: No      Mobility  Bed Mobility Overal bed mobility: Needs Assistance Bed Mobility: Rolling;Sidelying to Sit Rolling: Modified independent (Device/Increase time) Sidelying to sit: Modified independent (Device/Increase time)       General bed mobility comments: Pt was able to transition to EOB with proper log roll technique and minimal cues for safety.   Transfers Overall transfer level: Modified independent Equipment used: None             General transfer comment: No assist required. No unsteadiness or LOB noted.   Ambulation/Gait Ambulation/Gait assistance: Modified independent (Device/Increase time) Ambulation Distance (Feet): 400 Feet Assistive device:  None Gait Pattern/deviations: Step-through pattern;Decreased stride length;Staggering right (occasional; infrequent) Gait velocity: Decreased Gait velocity interpretation: Below normal speed for age/gender General Gait Details: Occasional LOB to the R - pt reports this is due to her MS. Is able to recover without assistance.   Stairs Stairs: Yes Stairs assistance: Supervision Stair Management: One rail Left;Step to pattern;Forwards Number of Stairs: 10 General stair comments: Pt demonstrated proper sequencing and safety awareness.   Wheelchair Mobility    Modified Rankin (Stroke Patients Only)       Balance Overall balance assessment: No apparent balance deficits (not formally assessed) (Mild unsteadiness to the R due to MD. Per pt this is normal.)                                           Pertinent Vitals/Pain Pain Assessment: 0-10 Pain Score: 4  Pain Location: Incision Pain Descriptors / Indicators: Operative site guarding;Discomfort Pain Intervention(s): Limited activity within patient's tolerance;Monitored during session;Repositioned    Home Living Family/patient expects to be discharged to:: Private residence Living Arrangements: Alone Available Help at Discharge: Family;Available PRN/intermittently Type of Home: House Home Access: Stairs to enter Entrance Stairs-Rails: Right;Left;Can reach both Entrance Stairs-Number of Steps: 5 Home Layout: One level Home Equipment: None      Prior Function Level of Independence: Independent         Comments: Has been sitting on edge of tub and swinging her legs in when knee arthritis flares up.     Hand Dominance        Extremity/Trunk Assessment   Upper Extremity Assessment: Defer to OT evaluation  Lower Extremity Assessment: Overall WFL for tasks assessed      Cervical / Trunk Assessment: Other exceptions  Communication   Communication: No difficulties  Cognition  Arousal/Alertness: Awake/alert Behavior During Therapy: WFL for tasks assessed/performed Overall Cognitive Status: Within Functional Limits for tasks assessed                      General Comments      Exercises        Assessment/Plan    PT Assessment Patent does not need any further PT services  PT Diagnosis Difficulty walking;Acute pain   PT Problem List    PT Treatment Interventions     PT Goals (Current goals can be found in the Care Plan section) Acute Rehab PT Goals PT Goal Formulation: All assessment and education complete, DC therapy    Frequency     Barriers to discharge        Co-evaluation               End of Session Equipment Utilized During Treatment: Back brace Activity Tolerance: Patient tolerated treatment well Patient left: in chair;with call bell/phone within reach;with family/visitor present Nurse Communication: Mobility status         Time: 7858-8502 PT Time Calculation (min) (ACUTE ONLY): 13 min   Charges:   PT Evaluation $Initial PT Evaluation Tier I: 1 Procedure     PT G CodesConni Slipper 07/08/15, 8:02 AM  Conni Slipper, PT, DPT Acute Rehabilitation Services Pager: [phone]

## 2015-06-15 NOTE — Discharge Summary (Signed)
  Physician Discharge Summary  Patient ID: Christina Richard MRN: [id number] DOB/AGE: 1955-09-29 59 y.o.  Admit date: 06/14/2015 Discharge date: 06/15/2015  Admission Diagnoses: Lumbar spondylolisthesis, L4-5  Discharge Diagnoses: Same Principal Problem:   Spondylolisthesis Active Problems:   Lumbar spondylosis   Discharged Condition: Stable  Hospital Course:  Mrs. Christina Richard is a 59 y.o. female who presented to the clinic with primarily mechanical back pain. MRI demonstrating L4-5 spondylolisthesis with motion on dynamic films. The patient was admitted for elective XLIF with percutaneous screws at L4-5 which was done without complication. On POD#1 the patient was at her neurologic baseline, reporting relief of her back pain. Surgical pain was controlled with oral medication, she was ambulating without difficulty, voiding normally, and tolerating diet.  Treatments: Surgery - XLIF L4-5, Percutaneous screws L4-5  Discharge Exam: Blood pressure 105/59, pulse 80, temperature 97.7 F (36.5 C), temperature source Oral, resp. rate 18, height 5\' 2"  (1.575 m), weight 55.792 kg (123 lb), SpO2 97 %. Awake, alert, oriented Speech fluent, appropriate CN grossly intact 5/5 BUE/BLE Wound c/d/i  Disposition: Home     Medication List    TAKE these medications        CVS D3 1000 UNITS capsule  Generic drug:  Cholecalciferol  Take 1,000 Units by mouth daily.     esomeprazole 20 MG capsule  Commonly known as:  NEXIUM  Take 20 mg by mouth daily at 12 noon.     estradiol 0.5 MG tablet  Commonly known as:  ESTRACE  Take 1 tablet by mouth daily.     loratadine 10 MG tablet  Commonly known as:  CLARITIN  Take 10 mg by mouth daily.     medroxyPROGESTERone 2.5 MG tablet  Commonly known as:  PROVERA  Take 1 tablet by mouth daily.     multivitamin with minerals tablet  Take 1 tablet by mouth daily.     NEURONTIN 300 MG capsule  Generic drug:  gabapentin  Take 1 capsule by  mouth at bedtime.     oxyCODONE-acetaminophen 5-325 MG tablet  Commonly known as:  PERCOCET/ROXICET  Take 1-2 tablets by mouth every 4 (four) hours as needed for moderate pain.     PROVIGIL 200 MG tablet  Generic drug:  modafinil  Take 1 tablet by mouth daily.     TECFIDERA 240 MG Cpdr  Generic drug:  Dimethyl Fumarate  Take 1 capsule by mouth 2 (two) times daily.           Follow-up Information    Follow up with [facility], Kathleene Bergemann, C, MD In 2 weeks.   Specialty:  Neurosurgery   Contact information:   [address] [place] [address] [phone]       Signed: Jackelyn Hoehn 06/15/2015, 7:41 AM

## 2015-06-15 NOTE — Progress Notes (Signed)
Pt doing well. Pt given D/C instructions with Rx, verbal understanding was provided. Pt's IV was removed prior to D/C. Pt's incisions are covered with Honeycomb dressing and they have no sign of infection. Pt D/C'd home via walking per MD order. Pt is stable @ D/C and has no other needs at this time. Rema Fendt, RN

## 2015-06-15 NOTE — Evaluation (Signed)
Occupational Therapy Evaluation Patient Details Name: Christina Richard MRN: [id number] DOB: 1956-04-28 Today's Date: 06/15/2015    History of Present Illness Pt is a 59 y/o female who presents s/p L4-L5 Ant Lumbar fusion on 06/14/15. Pt has history of MS.    Clinical Impression   Patient evaluated by Occupational Therapy with no further acute OT needs identified. All education has been completed and the patient has no further questions. See below for any follow-up Occupational Therapy or equipment needs. OT to sign off. Thank you for referral.      Follow Up Recommendations  No OT follow up    Equipment Recommendations  None recommended by OT    Recommendations for Other Services       Precautions / Restrictions Precautions Precautions: Fall;Back Precaution Booklet Issued: Yes (comment) Precaution Comments: Reviewed handout and pt was educated on precautions throughout functional mobility.  Required Braces or Orthoses: Spinal Brace Spinal Brace: Lumbar corset;Applied in sitting position Restrictions Weight Bearing Restrictions: No      Mobility Bed Mobility Overal bed mobility: Needs Assistance Bed Mobility: Rolling;Sidelying to Sit Rolling: Modified independent (Device/Increase time) Sidelying to sit: Modified independent (Device/Increase time)       General bed mobility comments: in chair on arrival  Transfers Overall transfer level: Modified independent Equipment used: None             General transfer comment: No assist required. No unsteadiness or LOB noted.     Balance Overall balance assessment: No apparent balance deficits (not formally assessed) (Mild unsteadiness to the R due to MD. Per pt this is normal.)                                          ADL Overall ADL's : Needs assistance/impaired Eating/Feeding: Independent   Grooming: Wash/dry hands;Wash/dry face;Independent               Lower Body Dressing:  Supervision/safety;Sit to/from stand Lower Body Dressing Details (indicate cue type and reason): [place] and about to reach bil LE for dressing Toilet Transfer: Minimal assistance Toilet Transfer Details (indicate cue type and reason): will have sisters (A). handout provided on shower seat and pt plans to purchase in community           General ADL Comments: Pt will have sister (A) upon d/c home. pt has a dog and educated on pet care with back precautions including how to open sliding door with precautions.      Vision     Perception     Praxis      Pertinent Vitals/Pain Pain Assessment: 0-10 Pain Score: 4  Pain Location: surgerical site Pain Descriptors / Indicators: Discomfort Pain Intervention(s): Limited activity within patient's tolerance;Monitored during session     Hand Dominance Right   Extremity/Trunk Assessment Upper Extremity Assessment Upper Extremity Assessment: Overall WFL for tasks assessed   Lower Extremity Assessment Lower Extremity Assessment: Defer to PT evaluation   Cervical / Trunk Assessment Cervical / Trunk Assessment: Other exceptions Cervical / Trunk Exceptions: s/p surg   Communication Communication Communication: No difficulties   Cognition Arousal/Alertness: Awake/alert Behavior During Therapy: WFL for tasks assessed/performed Overall Cognitive Status: Within Functional Limits for tasks assessed                     General Comments       Exercises  Shoulder Instructions      Home Living Family/patient expects to be discharged to:: Private residence Living Arrangements: Alone Available Help at Discharge: Family;Available PRN/intermittently Type of Home: House Home Access: Stairs to enter Entrance Stairs-Number of Steps: 5 Entrance Stairs-Rails: Right;Left;Can reach both Home Layout: One level     Bathroom Shower/Tub: Chief Strategy Officer: Standard (Pt reports very low) Bathroom  Accessibility: Yes   Home Equipment: None          Prior Functioning/Environment Level of Independence: Independent        Comments: Has been sitting on edge of tub and swinging her legs in when knee arthritis flares up.    OT Diagnosis: Acute pain   OT Problem List:     OT Treatment/Interventions:      OT Goals(Current goals can be found in the care plan section) Acute Rehab OT Goals Potential to Achieve Goals: Good  OT Frequency:     Barriers to D/C:            Co-evaluation              End of Session Equipment Utilized During Treatment: Gait belt;Back brace Nurse Communication: Precautions  Activity Tolerance:   Patient left: in chair;with call bell/phone within reach   Time: 0817-0832 OT Time Calculation (min): 15 min Charges:  OT General Charges $OT Visit: 1 Procedure OT Evaluation $Initial OT Evaluation Tier I: 1 Procedure G-Codes:    Harolyn Rutherford Jul 07, 2015, 8:48 AM   Mateo Flow   OTR/L Pager: [phone] Office: [phone] .

## 2015-06-15 NOTE — Discharge Instructions (Signed)
Can remove transparent dressing and shower 48 hours after surgery Walk as much as possible No heavy lifting >10 lbs No excessive bending/twisting at the waist   Spinal Fusion Spinal fusion is a procedure to make 2 or more of the bones in your spinal column (vertebrae) grow together (fuse). This procedure stops movement between the vertebrae and can relieve pain and prevent deformity.  Spinal fusion is used to treat the following conditions:  Fractures of the spine.  Herniated disk (the spongy material [cartilage] between the vertebrae).  Abnormal curvatures of the spine, such as scoliosis or kyphosis.  A weak or an unstable spine, caused by infections or tumor. RISKS AND COMPLICATIONS Complications associated with spinal fusion are rare, but they can occur. Possible complications include:  Bleeding.  Infection near the incision.  Nerve damage. Signs of nerve damage are back pain, pain in one or both legs, weakness, or numbness.  Spinal fluid leakage.  Blood clot in your leg, which can move to your lungs.  Difficulty controlling urination or bowel movements. BEFORE THE PROCEDURE  A medical evaluation will be done. This will include a physical exam, blood tests, and imaging exams.  You will talk with an anesthesiologist. This is the person who will be in charge of the anesthesia during the procedure. Spinal fusion usually requires that you are asleep during the procedure (general anesthesia).  You will need to stop taking certain medicines, particularly those associated with an increased risk of bleeding. Ask your caregiver about changing or stopping your regular medicines.  If you smoke, you will need to stop at least 2 weeks before the procedure. Smoking can slow down the healing process, especially fusion of the vertebrae, and increase the risk of complications.  Do not eat or drink anything for at least 8 hours before the procedure. PROCEDURE  A cut (incision) is made  over the vertebrae that will be fused. The back muscles are separated from the vertebrae. If you are having this procedure to treat a herniated disk, the disc material pressing on the nerve root is removed (decompression). The area where the disk is removed is then filled with extra bone. Bone from another part of your body (autogenous bone) or bone from a bone donor (allograft bone) may be used. The extra bone promotes fusion between the vertebrae. Sometimes, specific medicines are added to the fusion area to promote bone healing. In most cases, screws and rods or metal plates will be used to attach the vertebrae to stabilize them while they fuse.  AFTER THE PROCEDURE   You will stay in a recovery area until the anesthesia has worn off. Your blood pressure and pulse will be checked frequently.  You will be given antibiotics to prevent infection.  You may continue to receive fluids through an intravenous (IV) tube while you are still in the hospital.  Pain after surgery is normal. You will be given pain medicine.  You will be taught how to move correctly and how to stand and walk. While in bed, you will be instructed to turn frequently, using a "log rolling" technique, in which the entire body is moved without twisting the back.   This information is not intended to replace advice given to you by your health care provider. Make sure you discuss any questions you have with your health care provider.   Document Released: 03/08/2003 Document Revised: 09/01/2011 Document Reviewed: 11/22/2014 Elsevier Interactive Patient Education [employer]

## 2015-06-15 NOTE — Progress Notes (Signed)
Utiilzation review completed.

## 2015-06-17 NOTE — Anesthesia Postprocedure Evaluation (Signed)
Anesthesia Post Note  Patient: Christina Richard  Procedure(s) Performed: Procedure(s) (LRB): LUMBAR FOUR-FIVE ANTERIOR LATERAL LUMBAR FUSION  (N/A) LUMBAR FOUR-FIVE LUMBAR PERCUTANEOUS PEDICLE SCREW  (N/A)  Patient location during evaluation: PACU Anesthesia Type: General Level of consciousness: awake Pain management: pain level controlled Vital Signs Assessment: post-procedure vital signs reviewed and stable Respiratory status: spontaneous breathing Cardiovascular status: stable Postop Assessment: no signs of nausea or vomiting Anesthetic complications: no    Last Vitals:  Filed Vitals:   06/15/15 0500 06/15/15 0814  BP: 105/59 104/68  Pulse: 80 77  Temp: 36.5 C 36.4 C  Resp: 18 18    Last Pain:  Filed Vitals:   06/15/15 0859  PainSc: 4                  Nikyla Navedo

## 2015-06-20 ENCOUNTER — Encounter (HOSPITAL_COMMUNITY): Payer: Self-pay | Admitting: Neurosurgery

## 2016-09-03 IMAGING — RF DG C-ARM GT 120 MIN
1 series · 6 of 6 positions shown · non-contrast
Comparison: None.

CLINICAL DATA: L4-5 XLIF

EXAM:
DG C-ARM GT 120 MIN; LUMBAR SPINE - COMPLETE 4+ VIEW
FLUOROSCOPY TIME:  If the device does not provide the exposure
index:
Fluoroscopy Time (in minutes and seconds):  2 minutes 56 seconds

[Series 1: run · 6 of 6 slices shown]
[im 1/6]
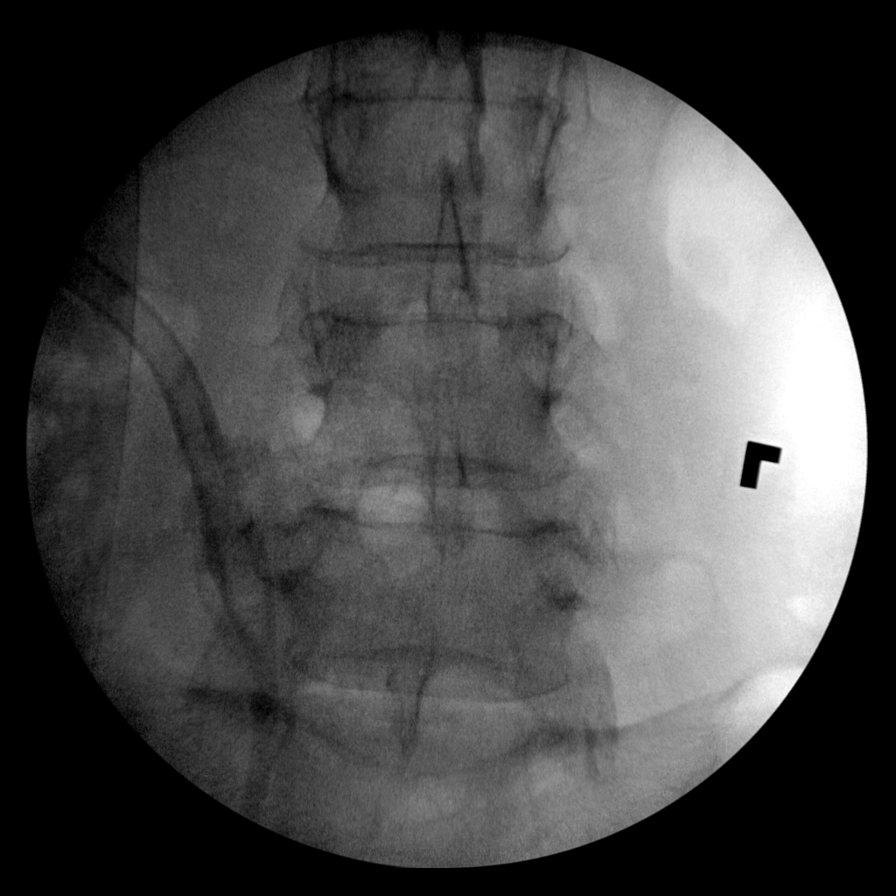
[im 2/6]
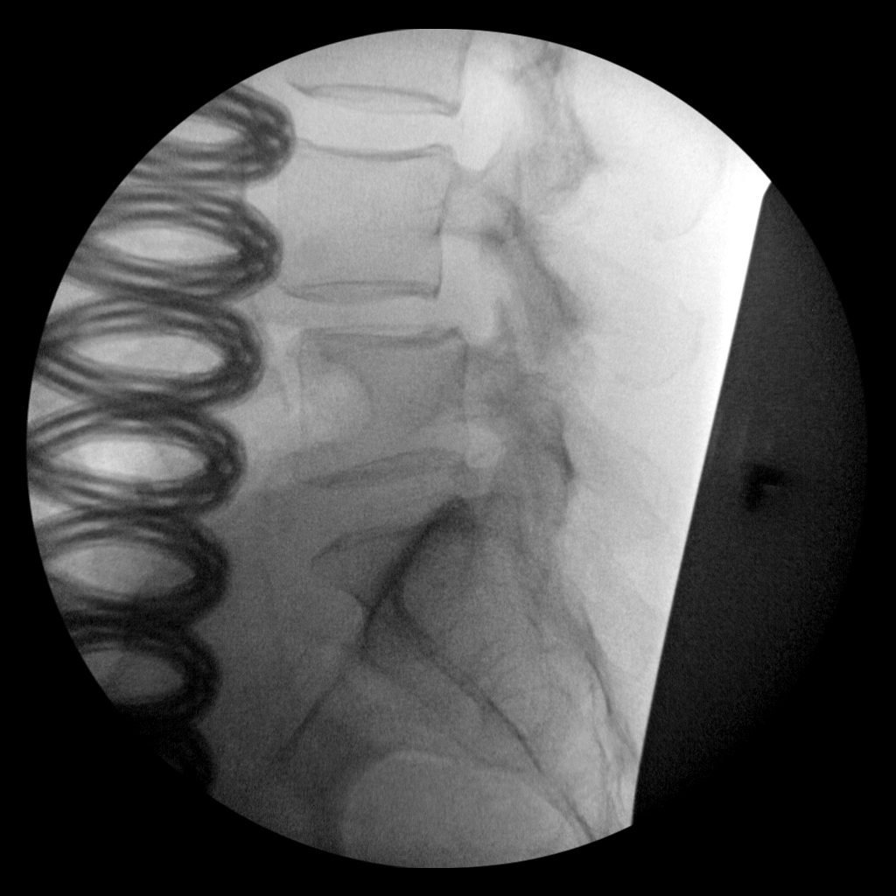
[im 3/6]
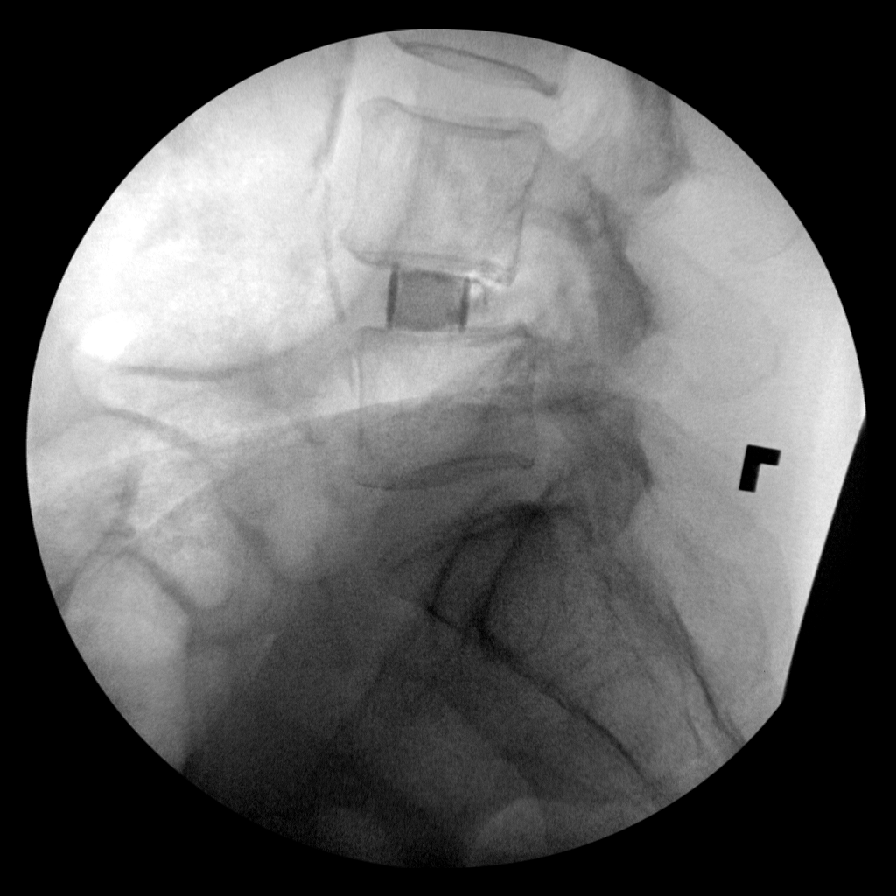
[im 4/6]
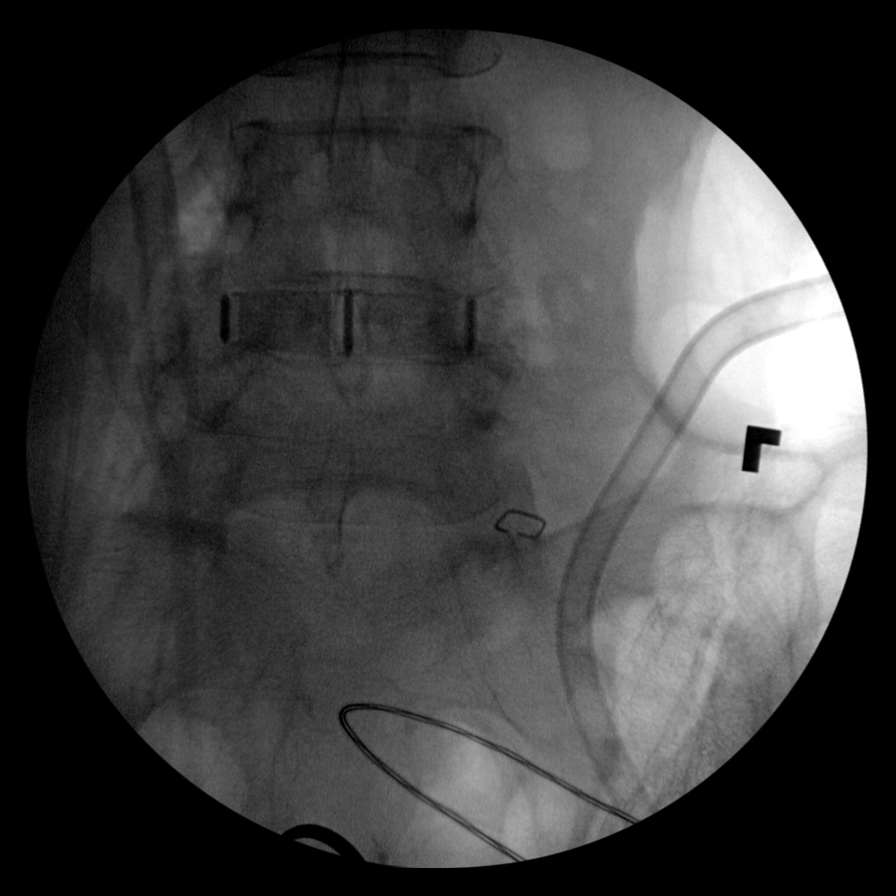
[im 5/6]
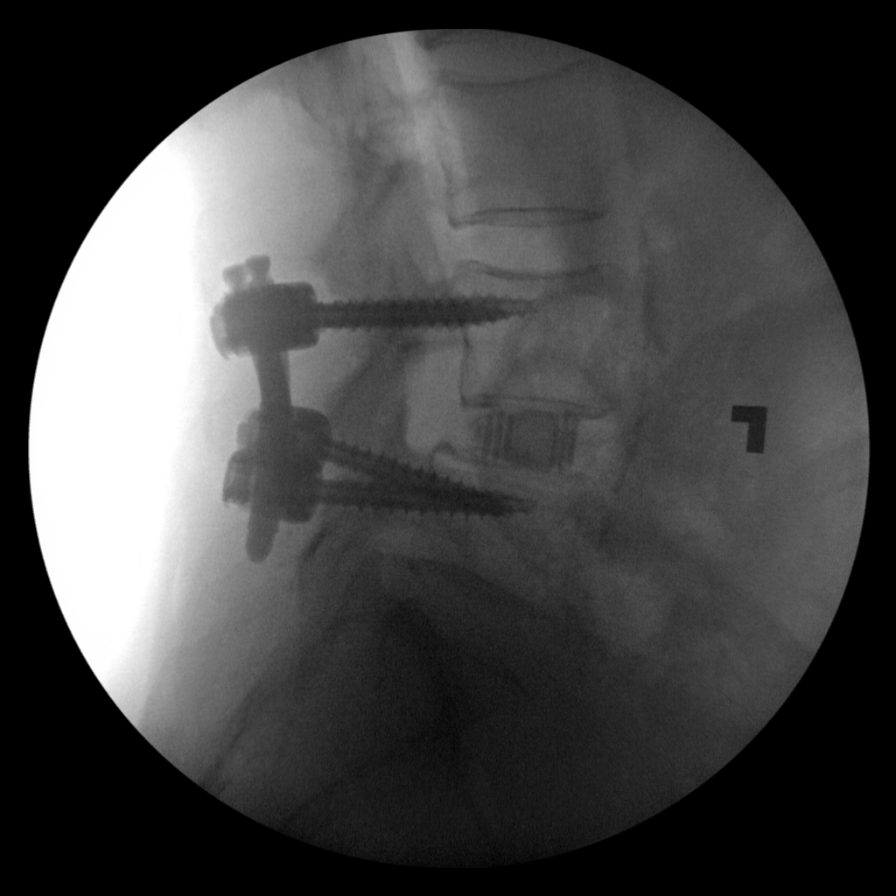
[im 6/6]
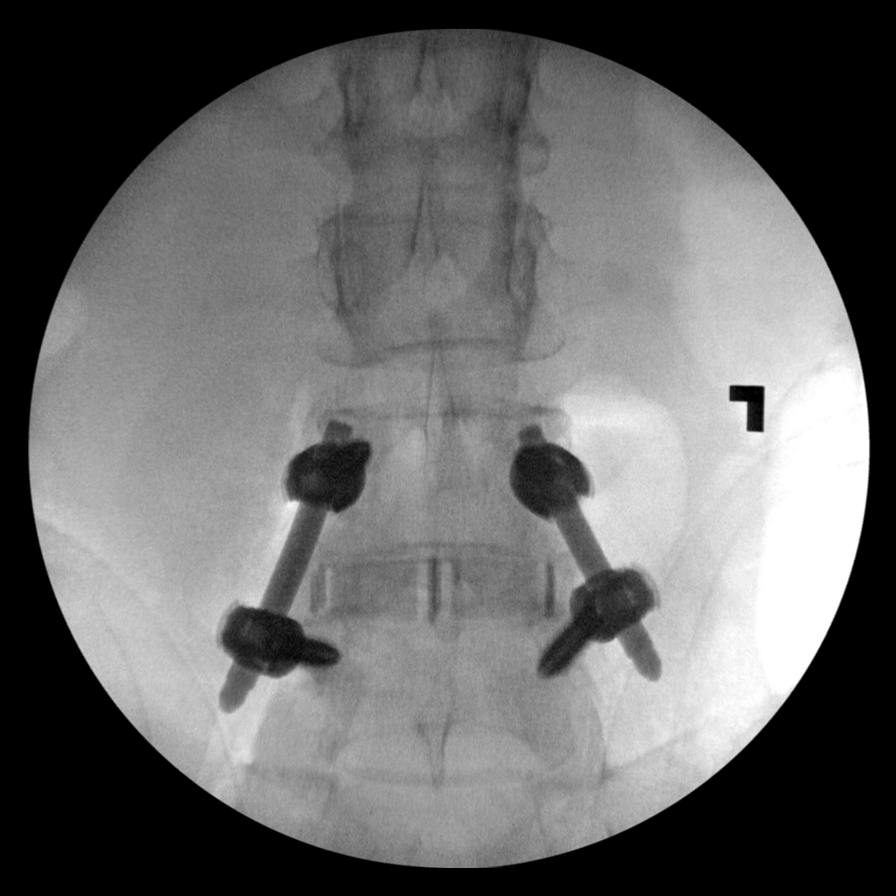

[6 of 6 positions shown; findings below may reference images not displayed]

FINDINGS: Six intraoperative fluoroscopic spot images demonstrate posterior
lumbar fusion and interbody graft material at L4-5. Grade 1
anterolisthesis of L4 on L5.
IMPRESSION: XLIF L4-5.

## 2016-10-06 ENCOUNTER — Ambulatory Visit
Admission: RE | Admit: 2016-10-06 | Discharge: 2016-10-06 | Disposition: A | Discharge status: Home / Self Care | Payer: BLUE CROSS/BLUE SHIELD | Source: Ambulatory Visit | Attending: Obstetrics & Gynecology | Admitting: Obstetrics & Gynecology

## 2016-10-06 ENCOUNTER — Other Ambulatory Visit: Payer: Self-pay | Admitting: Obstetrics & Gynecology

## 2016-10-06 DIAGNOSIS — N6002 Solitary cyst of left breast: Secondary | ICD-10-CM

## 2016-10-06 DIAGNOSIS — N63 Unspecified lump in unspecified breast: Secondary | ICD-10-CM

## 2016-10-06 HISTORY — DX: Unspecified lump in unspecified breast: N63.0

## 2016-11-05 ENCOUNTER — Other Ambulatory Visit: Payer: Self-pay | Admitting: Obstetrics & Gynecology

## 2016-11-05 DIAGNOSIS — R928 Other abnormal and inconclusive findings on diagnostic imaging of breast: Secondary | ICD-10-CM

## 2016-11-07 ENCOUNTER — Ambulatory Visit
Admission: RE | Admit: 2016-11-07 | Discharge: 2016-11-07 | Disposition: A | Discharge status: Home / Self Care | Payer: BLUE CROSS/BLUE SHIELD | Source: Ambulatory Visit | Attending: Obstetrics & Gynecology | Admitting: Obstetrics & Gynecology

## 2016-11-07 DIAGNOSIS — R928 Other abnormal and inconclusive findings on diagnostic imaging of breast: Secondary | ICD-10-CM

## 2017-11-24 ENCOUNTER — Other Ambulatory Visit: Payer: Self-pay | Admitting: Obstetrics & Gynecology

## 2017-11-24 DIAGNOSIS — Z1231 Encounter for screening mammogram for malignant neoplasm of breast: Secondary | ICD-10-CM

## 2018-01-28 IMAGING — MG 2D DIGITAL DIAGNOSTIC UNILATERAL RIGHT MAMMOGRAM WITH CAD AND AD
8 of 12 series · 8 of 28 positions shown · non-contrast
Comparison: 11/03/2016 and earlier

CLINICAL DATA: Patient returns after screening mammogram for
evaluation of possible masses in the right breast.

EXAM:
2D DIGITAL DIAGNOSTIC RIGHT MAMMOGRAM WITH CAD AND ADJUNCT TOMO
ULTRASOUND RIGHT BREAST

[R MLO (1 of 2)]
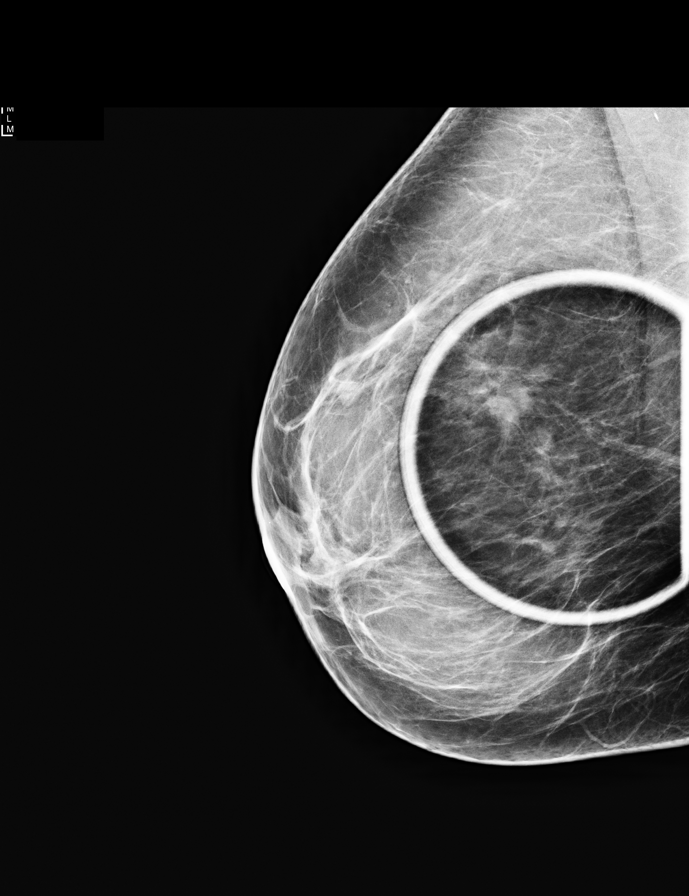

[R CC synth-2D (1 of 2)]
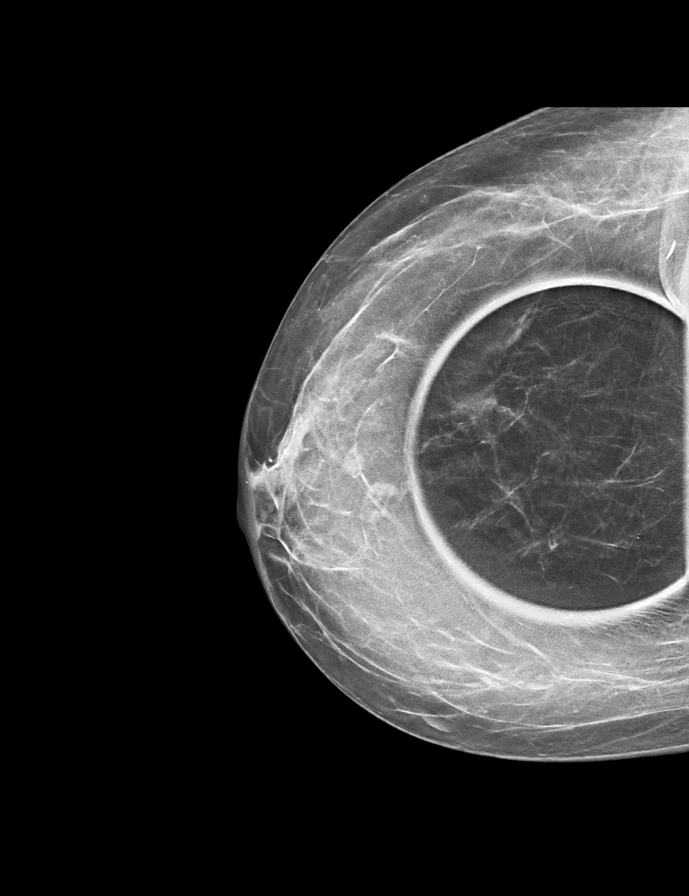

[R MLO synth-2D (1 of 2)]
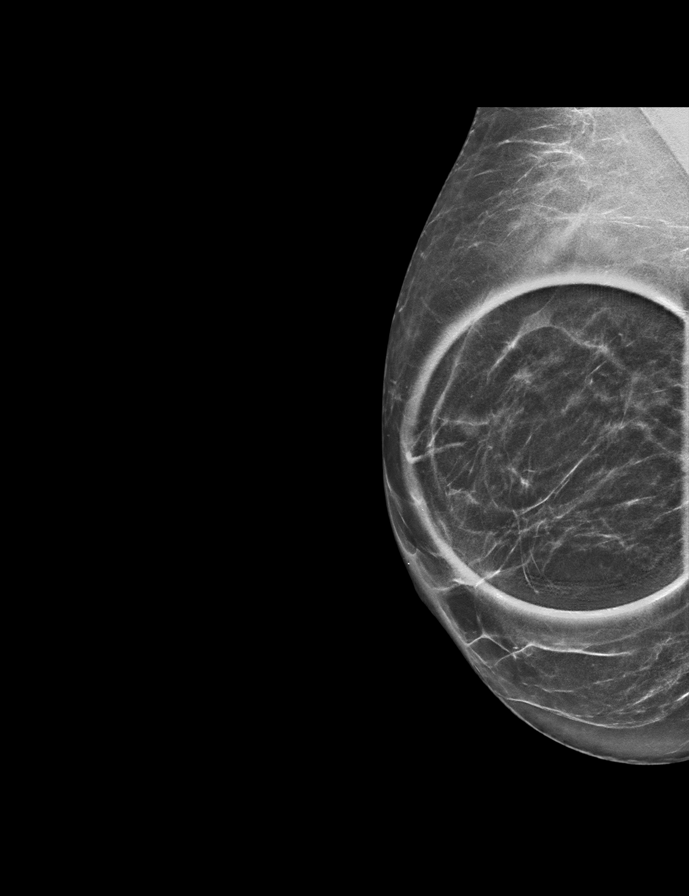

[R CC (1 of 2)]
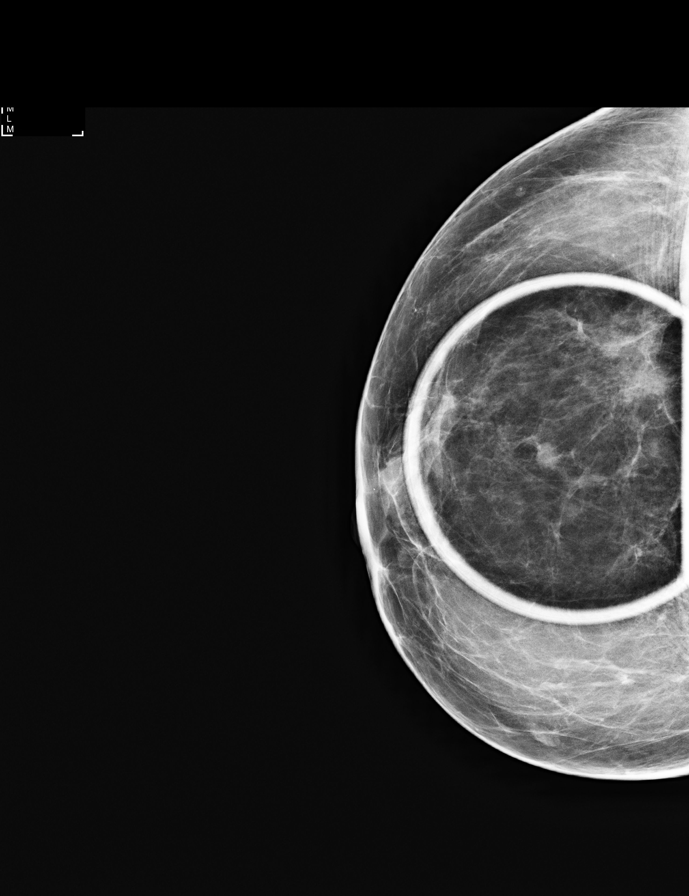

[R MLO (2 of 2)]
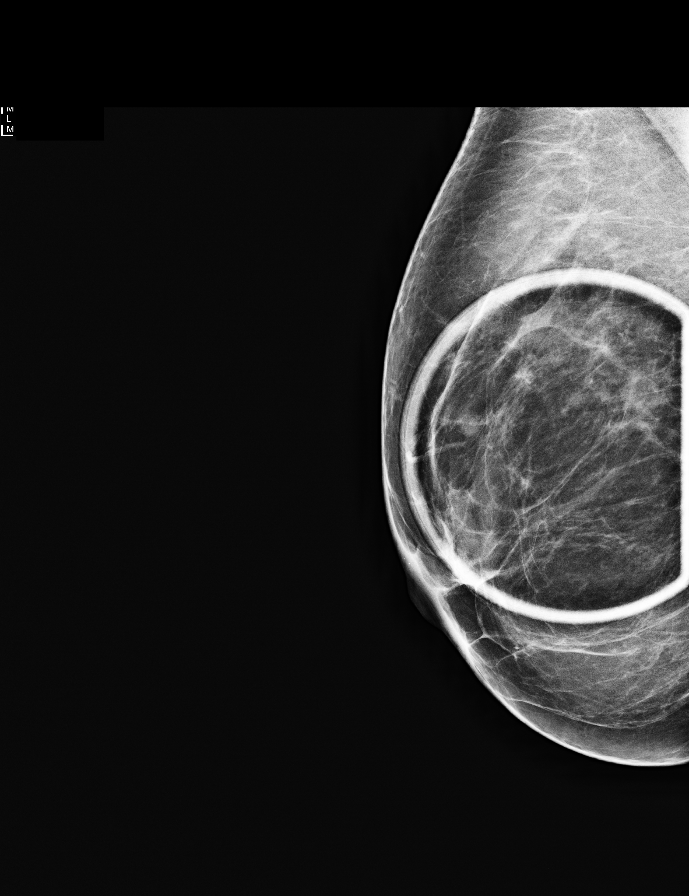

[R CC (2 of 2)]
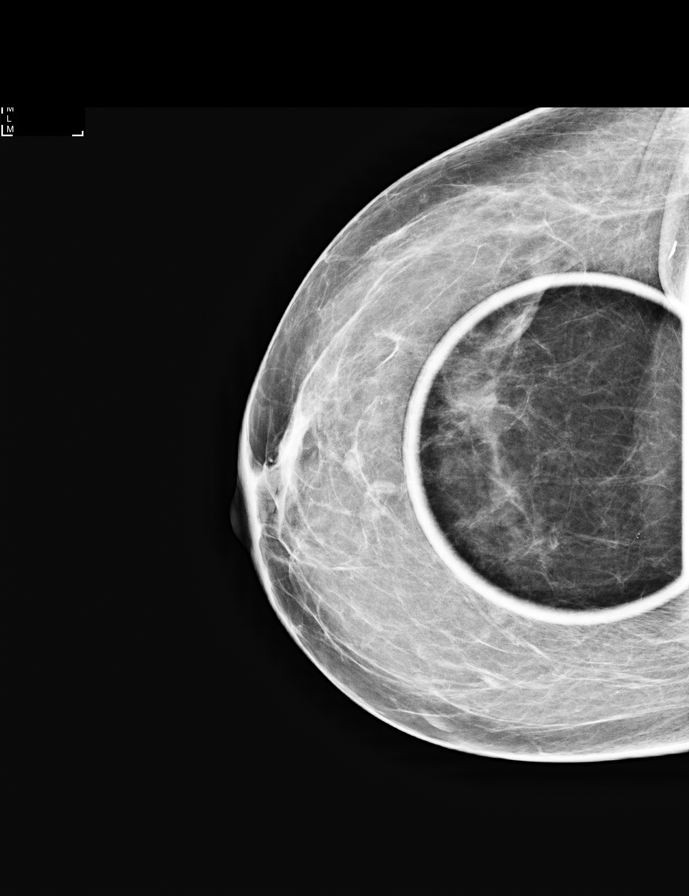

[R CC synth-2D (2 of 2)]
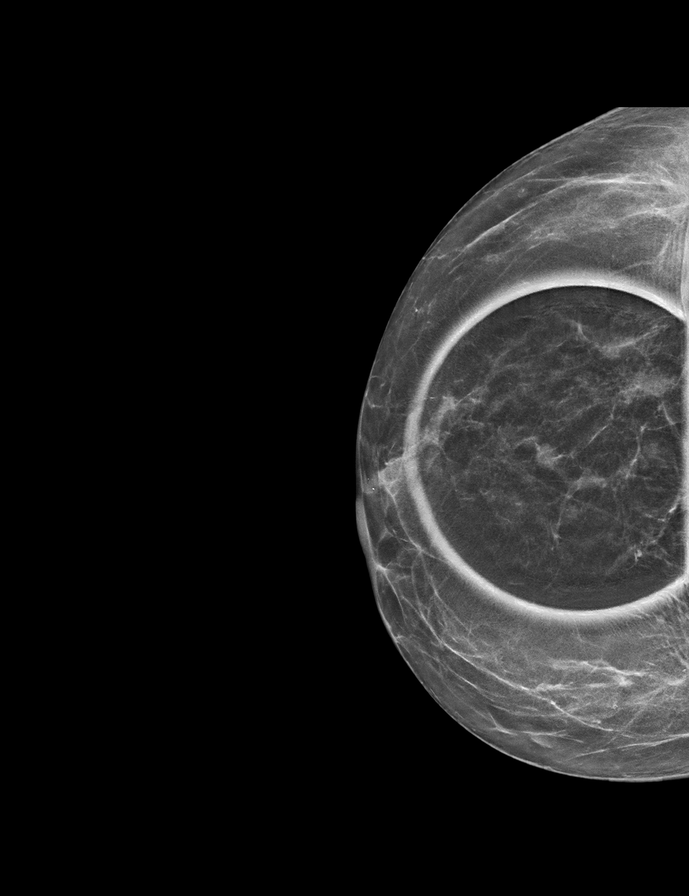

[R MLO synth-2D (2 of 2)]
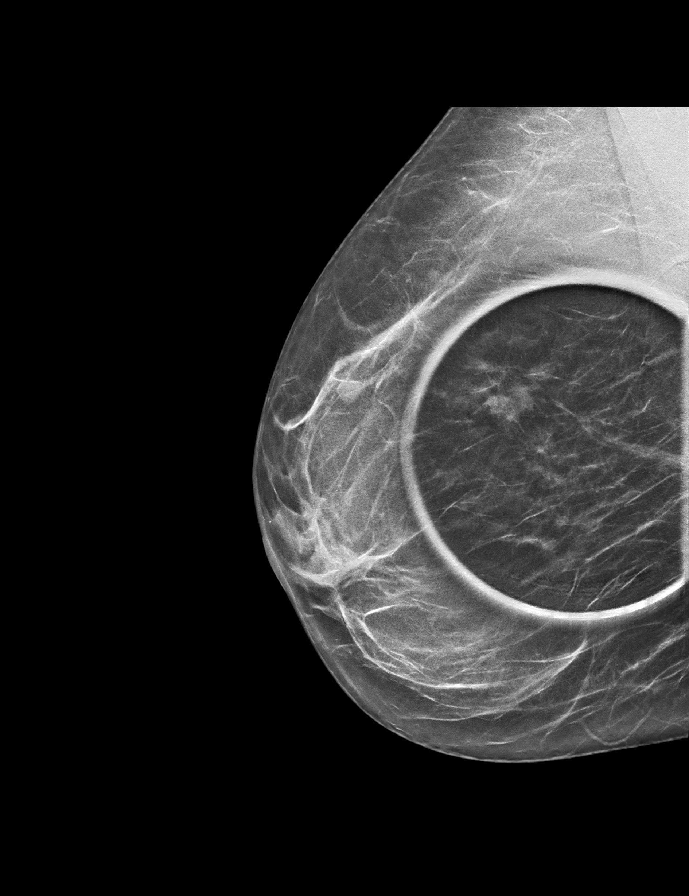

[8 of 28 positions shown; findings below may reference images not displayed]

ACR Breast Density Category b: There are scattered areas of
fibroglandular density.
FINDINGS: Additional 2-D and 3-D images are performed. These views confirm
presence of a circumscribed oval mass in the upper central portion
of the right breast. Evaluation of the second area concern in the
upper central portion of the right breast shows normal appearing
fibroglandular tissue without mass or distortion.

Mammographic images were processed with CAD.

On physical exam, I palpate no abnormality in the upper portion of
the right breast.

Targeted ultrasound is performed, showing a cyst with internal
septation in the 11 o'clock location of right breast 4 cm from
nipple which measures 0.6 x 0.3 x 0.6 cm. Ultrasound is also
performed of the remainder of the upper portion of the right breast,
demonstrating normal appearing fibroglandular tissue. No acoustic
shadowing or distortion.
IMPRESSION: 1.  No mammographic or ultrasound evidence for malignancy.
2. Benign cyst in the 11 o'clock location of the right breast.

RECOMMENDATION:
Screening mammogram in one year.(Code:GG-[id number])

I have discussed the findings and recommendations with the patient.
Results were also provided in writing at the conclusion of the
visit. If applicable, a reminder letter will be sent to the patient
regarding the next appointment.

BI-RADS CATEGORY  2: Benign.

## 2021-01-02 DIAGNOSIS — Z1211 Encounter for screening for malignant neoplasm of colon: Secondary | ICD-10-CM | POA: Insufficient documentation

## 2024-02-24 DIAGNOSIS — Z136 Encounter for screening for cardiovascular disorders: Secondary | ICD-10-CM | POA: Insufficient documentation

## 2024-03-04 ENCOUNTER — Ambulatory Visit: Attending: Cardiology | Admitting: Cardiology

## 2024-03-04 VITALS — BP 112/62 | HR 74 | Ht 62.0 in | Wt 113.8 lb

## 2024-03-04 DIAGNOSIS — K644 Residual hemorrhoidal skin tags: Secondary | ICD-10-CM | POA: Insufficient documentation

## 2024-03-04 DIAGNOSIS — G35 Multiple sclerosis: Secondary | ICD-10-CM | POA: Diagnosis not present

## 2024-03-04 DIAGNOSIS — E785 Hyperlipidemia, unspecified: Secondary | ICD-10-CM | POA: Diagnosis not present

## 2024-03-04 DIAGNOSIS — R32 Unspecified urinary incontinence: Secondary | ICD-10-CM | POA: Insufficient documentation

## 2024-03-04 DIAGNOSIS — N951 Menopausal and female climacteric states: Secondary | ICD-10-CM | POA: Insufficient documentation

## 2024-03-04 DIAGNOSIS — Z87891 Personal history of nicotine dependence: Secondary | ICD-10-CM

## 2024-03-04 DIAGNOSIS — I700 Atherosclerosis of aorta: Secondary | ICD-10-CM

## 2024-03-04 NOTE — Patient Instructions (Addendum)
 Medication Instructions:   START: Aspirin 81mg  1 tablet daily   Lab Work: None Ordered If you have labs (blood work) drawn today and your tests are completely normal, you will receive your results only by: MyChart Message (if you have MyChart) OR A paper copy in the mail If you have any lab test that is abnormal or we need to change your treatment, we will call you to review the results.   Testing/Procedures: We will order CT coronary calcium score. It will cost $99.00 and is not covered by insurance.  Please call to schedule.    [facility] [address] [place], [place] [address] [phone]    Your physician has requested that you have a carotid duplex. This test is an ultrasound of the carotid arteries in your neck. It looks at blood flow through these arteries that supply the brain with blood. Allow one hour for this exam. There are no restrictions or special instructions.    Follow-Up: At [facility], you and your health needs are our priority.  As part of our continuing mission to provide you with exceptional heart care, we have created designated Provider Care Teams.  These Care Teams include your primary Cardiologist (physician) and Advanced Practice Providers (APPs -  Physician Assistants and Nurse Practitioners) who all work together to provide you with the care you need, when you need it.  We recommend signing up for the patient portal called MyChart.  Sign up information is provided on this After Visit Summary.  MyChart is used to connect with patients for Virtual Visits (Telemedicine).  Patients are able to view lab/test results, encounter notes, upcoming appointments, etc.  Non-urgent messages can be sent to your provider as well.   To learn more about what you can do with MyChart, go to [email or web address].    Your next appointment:   2 month(s)  The format for your next appointment:   In Person  Provider:   Lamar Fitch, MD    Other  Instructions NA

## 2024-03-04 NOTE — Progress Notes (Signed)
 Cardiology Consultation:    Date:  03/04/2024   ID:  Christina Richard, DOB 29-Sep-1955, MRN [id number]  PCP:  Charlann Knee, NP  Cardiologist:  Lamar Fitch, MD   Referring MD: Nestor Elston NOVAK, NP   No chief complaint on file. I have calcification of my aorta  History of Present Illness:    Christina Richard is a 68 y.o. female who is being seen today for the evaluation of atherosclerosis of the aorta at the request of Tetter, Devin B, NP.  Past medical history significant for multiple sclerosis that she has diagnosed about 20 years ago stable on medical therapy, dyslipidemia, history of smoking but she quit many years ago, recently saw us  she started complaining of having some abdominal pain, CT of her abdomen has been done which revealed calcification of the aorta and she is here to talk about this.  Overall she is doing quite well.  She recently bought some old house and was very busy remodeling did not have any chest pain tightness squeezing pressure burning chest no shortness of breath overall doing well.  Overall she is surprised that she cardiologist.  No paroxysmal nocturnal dyspnea no palpitation no dizziness no swelling of lower extremities  Past Medical History:  Diagnosis Date   Arthritis    Breast mass 10/06/2016   left breast lump 900   GERD (gastroesophageal reflux disease)    MS (multiple sclerosis) (HCC)     Past Surgical History:  Procedure Laterality Date   ANTERIOR LAT LUMBAR FUSION N/A 06/14/2015   Procedure: LUMBAR FOUR-FIVE ANTERIOR LATERAL LUMBAR FUSION ;  Surgeon: Gerldine Maizes, MD;  Location: [facility];  Service: Neurosurgery;  Laterality: N/A;  L45 anterior lateral lumbar interbody fusion    BREAST CYST EXCISION Left 1994   BREAST EXCISIONAL BIOPSY Left 1994   ESOPHAGOGASTRODUODENOSCOPY     LUMBAR PERCUTANEOUS PEDICLE SCREW 1 LEVEL N/A 06/14/2015   Procedure: LUMBAR FOUR-FIVE LUMBAR PERCUTANEOUS PEDICLE SCREW ;  Surgeon: Gerldine Maizes,  MD;  Location: [facility];  Service: Neurosurgery;  Laterality: N/A;  L45 percutaneous pedicle screws   NOSE SURGERY      Current Medications: Current Meds  Medication Sig   azelastine (ASTELIN) 0.1 % nasal spray Place 2 sprays into both nostrils 2 (two) times daily. Use in each nostril as directed   Dimethyl Fumarate  240 MG CPDR Take 240 mg by mouth daily.   esomeprazole (NEXIUM) 20 MG capsule Take 20 mg by mouth daily at 12 noon.   loratadine  (CLARITIN ) 10 MG tablet Take 10 mg by mouth daily.   MAGNESIUM MALATE PO Take by mouth.   Multiple Vitamins-Minerals (MULTIVITAMIN WITH MINERALS) tablet Take 1 tablet by mouth daily.   NEURONTIN  300 MG capsule Take 1 capsule by mouth at bedtime.     Allergies:   Patient has no known allergies.   Social History   Socioeconomic History   Marital status: Single    Spouse name: Not on file   Number of children: Not on file   Years of education: Not on file   Highest education level: Not on file  Occupational History   Not on file  Tobacco Use   Smoking status: Former   Smokeless tobacco: Not on file  Substance and Sexual Activity   Alcohol use: No   Drug use: No   Sexual activity: Not on file  Other Topics Concern   Not on file  Social History Narrative   Not on file   Social  Drivers of Corporate investment banker Strain: Not on file  Food Insecurity: Low Risk  (11/04/2022)   Received from [facility]   Hunger Vital Sign    Within the past 12 months, you worried that your food would run out before you got money to buy more: Never true    Within the past 12 months, the food you bought just didn't last and you didn't have money to get more. : Never true  Transportation Needs: No Transportation Needs (11/04/2022)   Received from [facility]    In the past 12 months, has lack of reliable transportation kept you from medical appointments, meetings, work or from getting things needed for daily living? : No   Physical Activity: Not on file  Stress: Not on file  Social Connections: Not on file     Family History: The patient's family history is not on file. ROS:   Please see the history of present illness.    All 14 point review of systems negative except as described per history of present illness.  EKGs/Labs/Other Studies Reviewed:    The following studies were reviewed today:   EKG:  EKG Interpretation Date/Time:  Friday March 04 2024 14:19:47 EDT Ventricular Rate:  74 PR Interval:  142 QRS Duration:  78 QT Interval:  382 QTC Calculation: 424 R Axis:   70  Text Interpretation: Normal sinus rhythm Normal ECG No previous ECGs available Confirmed by Bernie Charleston [phone]) on 03/04/2024 2:40:49 PM    Recent Labs: No results found for requested labs within last 365 days.  Recent Lipid Panel No results found for: CHOL, TRIG, HDL, CHOLHDL, VLDL, LDLCALC, LDLDIRECT  Physical Exam:    VS:  BP 112/62   Pulse 74   Ht 5' 2 (1.575 m)   Wt 113 lb 12.8 oz (51.6 kg)   SpO2 98%   BMI 20.81 kg/m     Wt Readings from Last 3 Encounters:  03/04/24 113 lb 12.8 oz (51.6 kg)  06/14/15 123 lb (55.8 kg)  05/29/15 128 lb 11.2 oz (58.4 kg)     GEN:  Well nourished, well developed in no acute distress HEENT: Normal NECK: No JVD; No carotid bruits LYMPHATICS: No lymphadenopathy CARDIAC: RRR, no murmurs, no rubs, no gallops RESPIRATORY:  Clear to auscultation without rales, wheezing or rhonchi  ABDOMEN: Soft, non-tender, non-distended MUSCULOSKELETAL:  No edema; No deformity  SKIN: Warm and dry NEUROLOGIC:  Alert and oriented x 3 PSYCHIATRIC:  Normal affect   ASSESSMENT:    1. Atherosclerosis of aorta (HCC)   2. MS (multiple sclerosis) (HCC)   3. Dyslipidemia   4. History of smoking    PLAN:    In order of problems listed above:  Atherosclerosis of aorta.  We had a long discussion about that.  I advised her to start taking baby aspirin every single day, we  will schedule her to have carotic ultrasounds to make sure she does not have any significant coronary artery stenosis, I will also schedule her to have a calcium score to see if there is any significant calcification of the coronary artery, based on that I will be able to advise her about potentially taking cholesterol medication. Dyslipidemia I did review K PN which show me her LDL at 144 HDL 71.  This dated from April and she was told to change her diet which she tried to do a look like she is trying to stick with Mediterranean diet but at the same  time she eats a lot of eggs and also ate some bacon.  Will wait for results of coronary calcium score as well as ultrasounds of her neck and then we will recheck fasting lipid profile and decide about therapy.  My feeling is that we will have to start cholesterol medication. History of smoking does not smoke anymore I encouraged her to abstain from smoking. Multiple sclerosis stable.   Medication Adjustments/Labs and Tests Ordered: Current medicines are reviewed at length with the patient today.  Concerns regarding medicines are outlined above.  Orders Placed This Encounter  Procedures   EKG 12-Lead   No orders of the defined types were placed in this encounter.   Signed, Lamar DOROTHA Fitch, MD, [facility]. 03/04/2024 2:56 PM    [facility]

## 2024-03-04 NOTE — Addendum Note (Signed)
 Addended by: ARLOA PLANAS D on: 03/04/2024 03:14 PM   Modules accepted: Orders

## 2024-03-14 ENCOUNTER — Ambulatory Visit (INDEPENDENT_AMBULATORY_CARE_PROVIDER_SITE_OTHER)
Admission: RE | Admit: 2024-03-14 | Discharge: 2024-03-14 | Disposition: A | Discharge status: Home / Self Care | Payer: Self-pay | Source: Ambulatory Visit | Attending: Cardiology | Admitting: Cardiology

## 2024-03-14 DIAGNOSIS — I700 Atherosclerosis of aorta: Secondary | ICD-10-CM

## 2024-03-18 ENCOUNTER — Ambulatory Visit: Payer: Self-pay | Admitting: Cardiology

## 2024-03-31 ENCOUNTER — Ambulatory Visit: Attending: Cardiology

## 2024-03-31 DIAGNOSIS — I700 Atherosclerosis of aorta: Secondary | ICD-10-CM | POA: Diagnosis not present

## 2024-04-17 ENCOUNTER — Encounter (HOSPITAL_BASED_OUTPATIENT_CLINIC_OR_DEPARTMENT_OTHER): Payer: Self-pay

## 2024-04-17 ENCOUNTER — Ambulatory Visit (HOSPITAL_BASED_OUTPATIENT_CLINIC_OR_DEPARTMENT_OTHER)
Admission: EM | Admit: 2024-04-17 | Discharge: 2024-04-17 | Disposition: A | Discharge status: Home / Self Care | Attending: Family Medicine | Admitting: Family Medicine

## 2024-04-17 DIAGNOSIS — Z23 Encounter for immunization: Secondary | ICD-10-CM

## 2024-04-17 DIAGNOSIS — S6992XA Unspecified injury of left wrist, hand and finger(s), initial encounter: Secondary | ICD-10-CM | POA: Diagnosis not present

## 2024-04-17 MED ORDER — TETANUS-DIPHTH-ACELL PERTUSSIS 5-2-15.5 LF-MCG/0.5 IM SUSP
0.5000 mL | Freq: Once | INTRAMUSCULAR | Status: AC
  Administered 2024-04-17: 0.5 mL via INTRAMUSCULAR

## 2024-04-17 NOTE — ED Triage Notes (Signed)
 Patient states picking up a piece of wood with a nail sticking out of it Friday. States noticed discoloration and tenderness to base of left thumb where nail entered skin. States it didn't go in far. Needs update on tetanus. No redness visible. Good ROM to thumb.

## 2024-04-17 NOTE — Discharge Instructions (Signed)
 No concerns on exam for infection.  I believe that you had a hematoma that has dissolved and now that is why you are having bruising.  You can put some ice on the area.  We updated your tetanus today here. Follow-up as needed

## 2024-04-17 NOTE — ED Provider Notes (Signed)
 [facility]    CSN: [id number] Arrival date & time: 04/17/24  1021      History   Chief Complaint Chief Complaint  Patient presents with   Puncture Wound    HPI Christina Richard is a 68 y.o. female.   Patient is a 68 year old female who presents today for left hand injury. Patient states picking up a piece of wood with a nail sticking out of it Friday. States noticed discoloration and tenderness to base of left thumb where nail entered skin. States it didn't go in far. Needs update on tetanus. No redness visible. Good ROM to thumb.       Past Medical History:  Diagnosis Date   Arthritis    Breast mass 10/06/2016   left breast lump 900   GERD (gastroesophageal reflux disease)    MS (multiple sclerosis)     Patient Active Problem List   Diagnosis Date Noted   External hemorrhoids 03/04/2024   Menopausal syndrome 03/04/2024   Urinary incontinence 03/04/2024   Atherosclerosis of aorta 03/04/2024   Dyslipidemia 03/04/2024   History of smoking 03/04/2024   Encounter for abdominal aortic aneurysm (AAA) screening 02/24/2024   Screening for malignant neoplasm of colon 01/02/2021   Lumbar spondylosis 06/14/2015   Spondylolisthesis 06/13/2015   Arthritis 06/13/2015   Restless leg syndrome 06/13/2015   MS (multiple sclerosis) 06/13/2015   Multiple sclerosis 01/13/2012    Past Surgical History:  Procedure Laterality Date   ANTERIOR LAT LUMBAR FUSION N/A 06/14/2015   Procedure: LUMBAR FOUR-FIVE ANTERIOR LATERAL LUMBAR FUSION ;  Surgeon: Gerldine Maizes, MD;  Location: [facility];  Service: Neurosurgery;  Laterality: N/A;  L45 anterior lateral lumbar interbody fusion    BREAST CYST EXCISION Left 1994   BREAST EXCISIONAL BIOPSY Left 1994   ESOPHAGOGASTRODUODENOSCOPY     LUMBAR PERCUTANEOUS PEDICLE SCREW 1 LEVEL N/A 06/14/2015   Procedure: LUMBAR FOUR-FIVE LUMBAR PERCUTANEOUS PEDICLE SCREW ;  Surgeon: Gerldine Maizes, MD;  Location: [facility];  Service:  Neurosurgery;  Laterality: N/A;  L45 percutaneous pedicle screws   NOSE SURGERY      OB History   No obstetric history on file.      Home Medications    Prior to Admission medications   Medication Sig Start Date End Date Taking? Authorizing Provider  azelastine (ASTELIN) 0.1 % nasal spray Place 2 sprays into both nostrils 2 (two) times daily. Use in each nostril as directed    [provider]  Dimethyl Fumarate  240 MG CPDR Take 240 mg by mouth daily.    [provider]  esomeprazole (NEXIUM) 20 MG capsule Take 20 mg by mouth daily at 12 noon.    [provider]  loratadine  (CLARITIN ) 10 MG tablet Take 10 mg by mouth daily.    [provider]  MAGNESIUM MALATE PO Take by mouth.    [provider]  Multiple Vitamins-Minerals (MULTIVITAMIN WITH MINERALS) tablet Take 1 tablet by mouth daily.    [provider]  NEURONTIN  300 MG capsule Take 1 capsule by mouth at bedtime. 04/10/15   [provider]    Family History History reviewed. No pertinent family history.  Social History Social History   Tobacco Use   Smoking status: Former  Substance Use Topics   Alcohol use: No   Drug use: No     Allergies   Patient has no known allergies.   Review of Systems Review of Systems  See HPI Physical Exam Triage Vital Signs ED  Triage Vitals  Encounter Vitals Group     BP 04/17/24 1052 114/73     Girls Systolic BP Percentile --      Girls Diastolic BP Percentile --      Boys Systolic BP Percentile --      Boys Diastolic BP Percentile --      Pulse Rate 04/17/24 1052 77     Resp 04/17/24 1052 20     Temp 04/17/24 1052 98.3 F (36.8 C)     Temp Source 04/17/24 1052 Oral     SpO2 04/17/24 1052 95 %     Weight --      Height --      Head Circumference --      Peak Flow --      Pain Score 04/17/24 1054 2     Pain Loc --      Pain Education --      Exclude from Growth Chart --    No data found.  Updated Vital  Signs BP 114/73 (BP Location: Right Arm)   Pulse 77   Temp 98.3 F (36.8 C) (Oral)   Resp 20   SpO2 95%   Visual Acuity Right Eye Distance:   Left Eye Distance:   Bilateral Distance:    Right Eye Near:   Left Eye Near:    Bilateral Near:     Physical Exam Vitals and nursing note reviewed.  Constitutional:      General: She is not in acute distress.    Appearance: Normal appearance. She is not ill-appearing, toxic-appearing or diaphoretic.  Pulmonary:     Effort: Pulmonary effort is normal.  Musculoskeletal:     Comments: Normal range of motion to left thumb.  There is mild swelling and bruising to the base of the thumb and palm of the hand.  Small puncture noted.  There is no redness or drainage.  Skin:    General: Skin is warm and dry.     Findings: Bruising present.  Neurological:     Mental Status: She is alert.  Psychiatric:        Mood and Affect: Mood normal.      UC Treatments / Results  Labs (all labs ordered are listed, but only abnormal results are displayed) Labs Reviewed - No data to display  EKG   Radiology No results found.  Procedures Procedures (including critical care time)  Medications Ordered in UC Medications  Tdap (ADACEL) injection 0.5 mL (0.5 mLs Intramuscular Given 04/17/24 1102)    Initial Impression / Assessment and Plan / UC Course  I have reviewed the triage vital signs and the nursing notes.  Pertinent labs & imaging results that were available during my care of the patient were reviewed by me and considered in my medical decision making (see chart for details).     Left hand injury-no concerns on exam today.  Tetanus updated due to being out of date and puncture wound with nail.   No concerns for infection at this time. Follow-up as needed Final Clinical Impressions(s) / UC Diagnoses   Final diagnoses:  Injury of left hand, initial encounter     Discharge Instructions      No concerns on exam for infection.  I  believe that you had a hematoma that has dissolved and now that is why you are having bruising.  You can put some ice on the area.  We updated your tetanus today here. Follow-up as needed    ED Prescriptions  None    PDMP not reviewed this encounter.   Adah Wilbert LABOR, FNP 04/17/24 1105

## 2024-05-05 ENCOUNTER — Encounter: Payer: Self-pay | Admitting: Cardiology

## 2024-05-05 ENCOUNTER — Ambulatory Visit: Attending: Cardiology | Admitting: Cardiology

## 2024-05-05 VITALS — BP 110/60 | HR 87 | Ht 62.0 in | Wt 113.0 lb

## 2024-05-05 DIAGNOSIS — Z87891 Personal history of nicotine dependence: Secondary | ICD-10-CM

## 2024-05-05 DIAGNOSIS — R931 Abnormal findings on diagnostic imaging of heart and coronary circulation: Secondary | ICD-10-CM | POA: Diagnosis not present

## 2024-05-05 DIAGNOSIS — G35D Multiple sclerosis, unspecified: Secondary | ICD-10-CM

## 2024-05-05 DIAGNOSIS — E785 Hyperlipidemia, unspecified: Secondary | ICD-10-CM

## 2024-05-05 DIAGNOSIS — I700 Atherosclerosis of aorta: Secondary | ICD-10-CM

## 2024-05-05 NOTE — Progress Notes (Signed)
 Cardiology Office Note:    Date:  05/05/2024   ID:  PERLE BRICKHOUSE, DOB [birth year], MRN [id number]  PCP:  Brinda Fish I, NP  Cardiologist:  Lamar Fitch, MD    Referring MD: Charlann Knee, NP   Chief Complaint  Patient presents with   Follow-up  Doing well  History of Present Illness:    Christina Richard is a 68 y.o. female past medical history significant for atherosclerosis of the aorta, multiple sclerosis diagnosed more than 20 years ago stable on medical therapy, dyslipidemia, she was referred to us  because she was noted to have calcification of the aorta and coronary arteries, we did do calcium score which showed total calcium score of 1128 which is 99th percentile.  She also Dyslipidemia with last LDL checked in April 244, she comes today to talk about this.  She denies having typical chest pain tightness squeezing pressure mid chest, but she described the fact that she is having pains all over the body all the time since difficult related to jobs.  Described to have some fatigue tiredness shortness of breath while walking.  Past Medical History:  Diagnosis Date   Arthritis    Breast mass 10/06/2016   left breast lump 900   GERD (gastroesophageal reflux disease)    MS (multiple sclerosis)     Past Surgical History:  Procedure Laterality Date   ANTERIOR LAT LUMBAR FUSION N/A 06/14/2015   Procedure: LUMBAR FOUR-FIVE ANTERIOR LATERAL LUMBAR FUSION ;  Surgeon: Gerldine Maizes, MD;  Location: [facility];  Service: Neurosurgery;  Laterality: N/A;  L45 anterior lateral lumbar interbody fusion    BREAST CYST EXCISION Left 1994   BREAST EXCISIONAL BIOPSY Left 1994   ESOPHAGOGASTRODUODENOSCOPY     LUMBAR PERCUTANEOUS PEDICLE SCREW 1 LEVEL N/A 06/14/2015   Procedure: LUMBAR FOUR-FIVE LUMBAR PERCUTANEOUS PEDICLE SCREW ;  Surgeon: Gerldine Maizes, MD;  Location: [facility];  Service: Neurosurgery;  Laterality: N/A;  L45 percutaneous pedicle screws   NOSE SURGERY       Current Medications: Current Meds  Medication Sig   azelastine (ASTELIN) 0.1 % nasal spray Place 2 sprays into both nostrils 2 (two) times daily. Use in each nostril as directed   Dimethyl Fumarate  240 MG CPDR Take 240 mg by mouth daily.   esomeprazole (NEXIUM) 20 MG capsule Take 20 mg by mouth daily at 12 noon.   loratadine  (CLARITIN ) 10 MG tablet Take 10 mg by mouth daily.   MAGNESIUM MALATE PO Take by mouth.   Multiple Vitamins-Minerals (MULTIVITAMIN WITH MINERALS) tablet Take 1 tablet by mouth daily.   NEURONTIN  300 MG capsule Take 1 capsule by mouth at bedtime.     Allergies:   Patient has no known allergies.   Social History   Socioeconomic History   Marital status: Single    Spouse name: Not on file   Number of children: Not on file   Years of education: Not on file   Highest education level: Not on file  Occupational History   Not on file  Tobacco Use   Smoking status: Former   Smokeless tobacco: Not on file  Substance and Sexual Activity   Alcohol use: No   Drug use: No   Sexual activity: Not on file  Other Topics Concern   Not on file  Social History Narrative   Not on file   Social Drivers of Health   Financial Resource Strain: Not on file  Food Insecurity: Low Risk  (04/26/2024)   Received  from [facility]   Hunger Vital Sign    Within the past 12 months, you worried that your food would run out before you got money to buy more: Never true    Within the past 12 months, the food you bought just didn't last and you didn't have money to get more. : Never true  Transportation Needs: No Transportation Needs (04/26/2024)   Received from [facility]    In the past 12 months, has lack of reliable transportation kept you from medical appointments, meetings, work or from getting things needed for daily living? : No  Physical Activity: Not on file  Stress: Not on file  Social Connections: Not on file     Family History: The patient's  family history is not on file. ROS:   Please see the history of present illness.    All 14 point review of systems negative except as described per history of present illness  EKGs/Labs/Other Studies Reviewed:         Recent Labs: No results found for requested labs within last 365 days.  Recent Lipid Panel No results found for: CHOL, TRIG, HDL, CHOLHDL, VLDL, LDLCALC, LDLDIRECT  Physical Exam:    VS:  BP 110/60   Pulse 87   Ht 5' 2 (1.575 m)   Wt 113 lb (51.3 kg)   SpO2 96%   BMI 20.67 kg/m     Wt Readings from Last 3 Encounters:  05/05/24 113 lb (51.3 kg)  03/04/24 113 lb 12.8 oz (51.6 kg)  06/14/15 123 lb (55.8 kg)     GEN:  Well nourished, well developed in no acute distress HEENT: Normal NECK: No JVD; No carotid bruits LYMPHATICS: No lymphadenopathy CARDIAC: RRR, no murmurs, no rubs, no gallops RESPIRATORY:  Clear to auscultation without rales, wheezing or rhonchi  ABDOMEN: Soft, non-tender, non-distended MUSCULOSKELETAL:  No edema; No deformity  SKIN: Warm and dry LOWER EXTREMITIES: no swelling NEUROLOGIC:  Alert and oriented x 3 PSYCHIATRIC:  Normal affect   ASSESSMENT:    1. Elevated coronary artery calcium score 1128 which is 99th percentile   2. Multiple sclerosis   3. Atherosclerosis of aorta   4. History of smoking   5. Dyslipidemia    PLAN:    In order of problems listed above:  High calcium score, we discussed at length the significance of this.  She agreed to have a stress test to rule out any obstructive disease.  In the meantime she is on dual antiplatelet therapy which I will continue. Dyslipidemia her LDL is 144 that was checked last time in April but since that time she checks with diet and try to be [place] more active with some reservations.  I will recheck her fasting lipid profile and then decide to treat I want to put her on statin right away she does not want to do she wants to wait for results of her cholesterol and  then decide. Multiple sclerosis stable. History of smoking does not smoke anymore   Medication Adjustments/Labs and Tests Ordered: Current medicines are reviewed at length with the patient today.  Concerns regarding medicines are outlined above.  No orders of the defined types were placed in this encounter.  Medication changes: No orders of the defined types were placed in this encounter.   Signed, Lamar DOROTHA Fitch, MD, [facility] 05/05/2024 1:15 PM    [facility]

## 2024-05-05 NOTE — Addendum Note (Signed)
 Addended by: ARLOA PLANAS D on: 05/05/2024 01:25 PM   Modules accepted: Orders

## 2024-05-05 NOTE — Patient Instructions (Signed)
 Medication Instructions:  Your physician recommends that you continue on your current medications as directed. Please refer to the Current Medication list given to you today.  *If you need a refill on your cardiac medications before your next appointment, please call your pharmacy*   Lab Work: Your physician recommends that you return for lab work in: when fasting You need to have labs done when you are fasting.  You can come Monday through Friday 8:30 am to 12:00 pm and 1:15 to 4:30. You do not need to make an appointment as the order has already been placed.     Testing/Procedures: Your physician has requested that you have a lexiscan myoview. For further information please visit [email or web address] Please follow instruction sheet, as given.  The test will take approximately 3 to 4 hours to complete; you may bring reading material.  If someone comes with you to your appointment, they will need to remain in the main lobby due to limited space in the testing area.   How to prepare for your Myocardial Perfusion Test: Do not eat or drink 3 hours prior to your test, except you may have water. Do not consume products containing caffeine (regular or decaffeinated) 12 hours prior to your test. (ex: coffee, chocolate, sodas, tea). Do bring a list of your current medications with you.  If not listed below, you may take your medications as normal. Do wear comfortable clothes (no dresses or overalls) and walking shoes, tennis shoes preferred (No heels or open toe shoes are allowed). Do NOT wear cologne, perfume, aftershave, or lotions (deodorant is allowed). If these instructions are not followed, your test will have to be rescheduled.     Follow-Up: At [facility], you and your health needs are our priority.  As part of our continuing mission to provide you with exceptional heart care, we have created designated Provider Care Teams.  These Care Teams include your primary Cardiologist (physician)  and Advanced Practice Providers (APPs -  Physician Assistants and Nurse Practitioners) who all work together to provide you with the care you need, when you need it.  We recommend signing up for the patient portal called MyChart.  Sign up information is provided on this After Visit Summary.  MyChart is used to connect with patients for Virtual Visits (Telemedicine).  Patients are able to view lab/test results, encounter notes, upcoming appointments, etc.  Non-urgent messages can be sent to your provider as well.   To learn more about what you can do with MyChart, go to [email or web address].    Your next appointment:   6 month(s)  The format for your next appointment:   In Person  Provider:   Lamar Fitch, MD    Other Instructions NA

## 2024-05-08 LAB — LIPOPROTEIN A (LPA): Lipoprotein (a): 12.3 nmol/L (ref <75.0)

## 2024-05-08 LAB — LIPID PANEL
Chol/HDL Ratio: 3.1 ratio (ref 0.0–4.4)
Cholesterol, Total: 230 mg/dL — ABNORMAL HIGH (ref 100–199)
HDL: 74 mg/dL (ref >39)
LDL Chol Calc (NIH): 146 mg/dL — ABNORMAL HIGH (ref 0–99)
Triglycerides: 59 mg/dL (ref 0–149)
VLDL Cholesterol Cal: 10 mg/dL (ref 5–40)

## 2024-05-08 LAB — AST: AST: 10 IU/L (ref 0–40)

## 2024-05-08 LAB — ALT: ALT: 22 IU/L (ref 0–32)

## 2024-05-12 ENCOUNTER — Ambulatory Visit: Payer: Self-pay | Admitting: Cardiology

## 2024-05-12 DIAGNOSIS — R931 Abnormal findings on diagnostic imaging of heart and coronary circulation: Secondary | ICD-10-CM

## 2024-05-12 DIAGNOSIS — E785 Hyperlipidemia, unspecified: Secondary | ICD-10-CM

## 2024-05-16 MED ORDER — ATORVASTATIN CALCIUM 10 MG PO TABS: 10.0000 mg | ORAL_TABLET | Freq: Every day | ORAL | 3 refills | Status: AC

## 2024-05-16 NOTE — Telephone Encounter (Signed)
 Results reviewed with pt as per Dr. Vanetta Shawl note.  Pt verbalized understanding and had no additional questions. Routed to PCP

## 2024-05-16 NOTE — Telephone Encounter (Signed)
-----   Message from Lamar Fitch sent at 05/12/2024 10:05 AM EST ----- Cholesterol still very elevated I recommend start atorvastatin  10 mg daily, fasting lipid profile, AST ALT 6 weeks ----- Message ----- From: Interface, [facility] Lab Results In Sent: 05/08/2024   6:41 AM EST To: Lamar JINNY Fitch, MD

## 2024-05-18 ENCOUNTER — Telehealth: Payer: Self-pay | Admitting: *Deleted

## 2024-05-18 NOTE — Telephone Encounter (Signed)
 Spoke to patient as a reminder about her STRESS TEST on 05/25/24 at 8:00.

## 2024-05-25 ENCOUNTER — Ambulatory Visit: Attending: Cardiology

## 2024-05-25 DIAGNOSIS — R931 Abnormal findings on diagnostic imaging of heart and coronary circulation: Secondary | ICD-10-CM | POA: Diagnosis not present

## 2024-05-25 MED ORDER — TECHNETIUM TC 99M TETROFOSMIN IV KIT: 30.5000 | PACK | Freq: Once | INTRAVENOUS | Status: AC | PRN

## 2024-05-25 MED ORDER — TECHNETIUM TC 99M TETROFOSMIN IV KIT
30.5000 | PACK | Freq: Once | INTRAVENOUS | Status: AC | PRN
  Administered 2024-05-25: 30.5 via INTRAVENOUS

## 2024-05-25 MED ORDER — TECHNETIUM TC 99M TETROFOSMIN IV KIT
10.8000 | PACK | Freq: Once | INTRAVENOUS | Status: AC | PRN
  Administered 2024-05-25: 10.8 via INTRAVENOUS

## 2024-05-26 LAB — MYOCARDIAL PERFUSION IMAGING
Angina Index: 0
Estimated workload: 7
Exercise duration (min): 6 min
Exercise duration (sec): 0 s
LV dias vol: 54 mL (ref 46–106)
LV sys vol: 18 mL (ref 3.8–5.2)
MPHR: 154 {beats}/min
Nuc Stress EF: 67 %
Peak HR: 144 {beats}/min
Percent HR: 93 %
RPE: 18
Rest HR: 76 {beats}/min
Rest Nuclear Isotope Dose: 10.8 mCi
SDS: 1
SRS: 1
SSS: 2
Stress Nuclear Isotope Dose: 30.5 mCi
TID: 1.09
